# Patient Record
Sex: Female | Born: 1985 | Race: White | Hispanic: No | Marital: Single | Smoking: Never smoker
Health system: Southern US, Community
[De-identification: ages and names within clinical notes are randomized; demographics above are authoritative.]

## PROBLEM LIST (undated history)

## (undated) DIAGNOSIS — I10 Essential (primary) hypertension: Secondary | ICD-10-CM

## (undated) HISTORY — PX: OTHER SURGICAL HISTORY: SHX169

## (undated) HISTORY — DX: Essential (primary) hypertension: I10

---

## 2021-07-28 ENCOUNTER — Telehealth: Payer: Self-pay

## 2021-07-28 NOTE — Telephone Encounter (Signed)
REFERRAL ONLY SCANNED TO REFERRAL ?

## 2021-07-29 ENCOUNTER — Telehealth: Payer: Self-pay

## 2021-07-29 NOTE — Telephone Encounter (Signed)
NOTES SCANNED TO REFERRAL 

## 2021-09-12 ENCOUNTER — Encounter (HOSPITAL_BASED_OUTPATIENT_CLINIC_OR_DEPARTMENT_OTHER): Payer: Self-pay | Admitting: Cardiovascular Disease

## 2021-09-12 ENCOUNTER — Ambulatory Visit (INDEPENDENT_AMBULATORY_CARE_PROVIDER_SITE_OTHER): Payer: Managed Care, Other (non HMO) | Admitting: Cardiovascular Disease

## 2021-09-12 ENCOUNTER — Other Ambulatory Visit: Payer: Self-pay

## 2021-09-12 DIAGNOSIS — L659 Nonscarring hair loss, unspecified: Secondary | ICD-10-CM

## 2021-09-12 DIAGNOSIS — G90A Postural orthostatic tachycardia syndrome (POTS): Secondary | ICD-10-CM | POA: Diagnosis not present

## 2021-09-12 DIAGNOSIS — Q2116 Sinus venosus atrial septal defect, unspecified: Secondary | ICD-10-CM | POA: Diagnosis not present

## 2021-09-12 DIAGNOSIS — I10 Essential (primary) hypertension: Secondary | ICD-10-CM | POA: Diagnosis not present

## 2021-09-12 DIAGNOSIS — Z6835 Body mass index (BMI) 35.0-35.9, adult: Secondary | ICD-10-CM

## 2021-09-12 DIAGNOSIS — E669 Obesity, unspecified: Secondary | ICD-10-CM | POA: Diagnosis not present

## 2021-09-12 HISTORY — DX: Nonscarring hair loss, unspecified: L65.9

## 2021-09-12 HISTORY — DX: Essential (primary) hypertension: I10

## 2021-09-12 HISTORY — DX: Postural orthostatic tachycardia syndrome (POTS): G90.A

## 2021-09-12 HISTORY — DX: Obesity, unspecified: E66.9

## 2021-09-12 HISTORY — DX: Sinus venosus atrial septal defect, unspecified: Q21.16

## 2021-09-12 MED ORDER — ATENOLOL 25 MG PO TABS: ORAL_TABLET | ORAL | 3 refills | Status: DC

## 2021-09-12 MED ORDER — AMLODIPINE BESYLATE 2.5 MG PO TABS: 2.5000 mg | ORAL_TABLET | Freq: Every day | ORAL | 3 refills | Status: DC

## 2021-09-12 NOTE — Patient Instructions (Addendum)
Medication Instructions:  DECREASE YOUR ATENOLOL TO 25 MG 1/2 TABLET DAILY   START AMLODIPINE 2.5 MG DAILY   *If you need a refill on your cardiac medications before your next appointment, please call your pharmacy*  Lab Work: TSH/FT4/TS TODAY   If you have labs (blood work) drawn today and your tests are completely normal, you will receive your results only by: MyChart Message (if you have MyChart) OR A paper copy in the mail If you have any lab test that is abnormal or we need to change your treatment, we will call you to review the results.  Testing/Procedures: Your physician has requested that you have an echocardiogram. Echocardiography is a painless test that uses sound waves to create images of your heart. It provides your doctor with information about the size and shape of your heart and how well your heart's chambers and valves are working. This procedure takes approximately one hour. There are no restrictions for this procedure.   Follow-Up: At [facility], you and your health needs are our priority.  As part of our continuing mission to provide you with exceptional heart care, we have created designated Provider Care Teams.  These Care Teams include your primary Cardiologist (physician) and Advanced Practice Providers (APPs -  Physician Assistants and Nurse Practitioners) who all work together to provide you with the care you need, when you need it.  We recommend signing up for the patient portal called "MyChart".  Sign up information is provided on this After Visit Summary.  MyChart is used to connect with patients for Virtual Visits (Telemedicine).  Patients are able to view lab/test results, encounter notes, upcoming appointments, etc.  Non-urgent messages can be sent to your provider as well.   To learn more about what you can do with MyChart, go to [email or web address].    Your next appointment:   6 month(s)  The format for your next appointment:   In  Person  Provider:   Chilton Si, MD   Ronn Melena NP IN 1-2 MONTHS   You have been referred to   Where: [facility] Address: [address] [place] [address] Phone: [phone] IF YOU DO NOT HEAR FROM THEM IN 2 WEEKS CALL THEM DIRECTLY TO FOLLOW UP   Other Instructions MONITOR AND LOG YOUR BLOOD PRESSURE DAILY  BRING LOG AND MACHINE TO FOLLOW UP WITH CAITLIN

## 2021-09-12 NOTE — Assessment & Plan Note (Signed)
She has always struggled to lose weight despite eating well and exercising regularly.  She is interested in working with the healthy weight and wellness clinic.  We will also check a TSH, T3, and free T4 today.

## 2021-09-12 NOTE — Progress Notes (Signed)
Cardiology Office Note:    Date:  09/12/2021   ID:  Brittany [name], DOB 07/26/1986, MRN [id number]  PCP:  Pcp, No  Cardiologist:  None    Referring MD: No ref. provider found   No chief complaint on file.   History of Present Illness:    Brittany [name] is a 35 y.o. female with a hx of POTS who presents today for evaluation. She had an echo 04/2020 that revealed LVEF 65-70% and normal diastolic function. Her POTS has been managed with atenolol.  Today, she is doing well. She endorses POTS and a sinus venosus atrial septal defect. Her ASD was found on TEE performed in 2011 due to blood clots and discoloration in her L arm. She has been on aspirin since and denies recurrent blood clots. She was not qualified for surgery. She believes her blood pressure medicine is not effective. She used to be on Bystolic but was switched to atenolol due to dysphasia.  When she records her blood pressure at home, she reports measurements of 150s/100s which she attributes to stress from her job. Recently, she reports hair loss. She believes she was diagnosed with hypertension. Both of her parents were diagnosed with hypertension in their 1s. Her father has pericarditis and a pacemaker. Her maternal grandmother passed away this past 2022/12/01. She had dementia and had several strokes in the past year. She stays active by exercising 1 hour everyday and takes Wednesday and Saturday off occasionally. She has no exertional  symptoms. She has weights and a spin cycle machine at home and goes on a weekly 5.5 mile hike. She has lost 40lbs in the past year. For diet, she avoids red meat and opts for chicken, fish, and a mostly vegetarian diet. She avoids salt, soda, or juice. She occasionally drinks alcohol but only at social events. She works from home. She denies any palpitations, chest pain, or shortness of breath, lightheadedness, headaches, syncope, orthopnea, PND, lower extremity edema or exertional symptoms.  Past  Medical History:  Diagnosis Date   Essential hypertension 09/12/2021   Hair thinning 09/12/2021   Hypertension    Obesity 09/12/2021   POTS (postural orthostatic tachycardia syndrome) 09/12/2021   Sinus venosus atrial septal defect 09/12/2021    Past Surgical History:  Procedure Laterality Date   POTS      Current Medications: Current Meds  Medication Sig   aspirin EC 81 MG tablet Take 162 mg by mouth daily. Swallow whole.   atenolol (TENORMIN) 25 MG tablet Take 25 mg by mouth daily.   HAILEY FE 1.5/30 1.5-30 MG-MCG tablet Take 1 tablet by mouth daily.   vitamin C (ASCORBIC ACID) 500 MG tablet Take 500 mg by mouth daily.     Allergies:   Patient has no known allergies.   Social History   Socioeconomic History   Marital status: Single    Spouse name: Not on file   Number of children: Not on file   Years of education: Not on file   Highest education level: Not on file  Occupational History   Not on file  Tobacco Use   Smoking status: Never   Smokeless tobacco: Never  Substance and Sexual Activity   Alcohol use: Not Currently   Drug use: Never   Sexual activity: Not on file  Other Topics Concern   Not on file  Social History Narrative   Not on file   Social Determinants of Health   Financial Resource Strain: Low Risk    Difficulty of  Paying Living Expenses: Not hard at all  Food Insecurity: No Food Insecurity   Worried About Running Out of Food in the Last Year: Never true   Ran Out of Food in the Last Year: Never true  Transportation Needs: No Transportation Needs   Lack of Transportation (Medical): No   Lack of Transportation (Non-Medical): No  Physical Activity: Sufficiently Active   Days of Exercise per Week: 5 days   Minutes of Exercise per Session: 60 min  Stress: Not on file  Social Connections: Not on file     Family History: The patient's family history includes Brain cancer in her paternal grandmother; Dementia in her maternal grandmother;  Heart disease in her father; Hypertension in her father and mother; Stroke in her maternal grandmother.  ROS:   Please see the history of present illness.    (+) Sun sensitivity (+) Hair loss All other systems reviewed and negative.   EKGs/Labs/Other Studies Reviewed:     EKG:   09/12/21: Sinus rhythm, rate 64 bpm  Recent Labs: No results found for requested labs within last 8760 hours.   Recent Lipid Panel No results found for: CHOL, TRIG, HDL, CHOLHDL, VLDL, LDLCALC, LDLDIRECT  CHA2DS2-VASc Score =   [ ] .  Therefore, the patient's annual risk of stroke is   %.        Physical Exam:    VS:  BP 134/90 (BP Location: Left Arm, Patient Position: Sitting, Cuff Size: Large)   Pulse 64   Ht 5\' 11"  (1.803 m)   Wt 253 lb 9.6 oz (115 kg)   BMI 35.37 kg/m  , BMI Body mass index is 35.37 kg/m. GENERAL:  Well appearing HEENT: Pupils equal round and reactive, fundi not visualized, oral mucosa unremarkable NECK:  No jugular venous distention, waveform within normal limits, carotid upstroke brisk and symmetric, no bruits, no thyromegaly LUNGS:  Clear to auscultation bilaterally HEART:  RRR.  PMI not displaced or sustained,S1 and S2 within normal limits, no S3, no S4, no clicks, no rubs, no murmurs ABD:  Flat, positive bowel sounds normal in frequency in pitch, no bruits, no rebound, no guarding, no midline pulsatile mass, no hepatomegaly, no splenomegaly EXT:  2 plus pulses throughout, no edema, no cyanosis no clubbing SKIN:  No rashes no nodules NEURO:  Cranial nerves II through XII grossly intact, motor grossly intact throughout PSYCH:  Cognitively intact, oriented to person place and time  ASSESSMENT:    1. Sinus venosus atrial septal defect   2. POTS (postural orthostatic tachycardia syndrome)   3. Hair thinning   4. Class 2 obesity without serious comorbidity with body mass index (BMI) of 35.0 to 35.9 in adult, unspecified obesity type   5. Essential hypertension    PLAN:     Sinus venosus atrial septal defect She developed thrombosis in the L UE in 2012.  Maintained on aspirin without recurrent thrombosis.  We will get a baseline echo.  Going with her OB/GYN to get off of hormone-based OCPs.  POTS (postural orthostatic tachycardia syndrome) Symptoms controlled on atenolol and improved since moving from .  Her trigger is dehydration and sun exposure.  She will continue to work on staying hydrated.  We will reduce atenolol to 12.5 mg.    Hair thinning She has noted hair thinning on atenolol.  We will check TSH, T3, free T4.  Obesity She has always struggled to lose weight despite eating well and exercising regularly.  She is interested in working with the healthy  weight and wellness clinic.  We will also check a TSH, T3, and free T4 today.  Essential hypertension Blood pressure is poorly controlled on atenolol.  She is also having some symptoms of hair loss.  Given that her POTS is under good control, will try to wean the atenolol to 12.5 mg daily.  We will stop it if she remains asymptomatic.  Add amlodipine 2.5 mg and asked her to check her blood pressures at home.  Avoid diuretics given her POTS.  In order of problems listed above:      Medication Adjustments/Labs and Tests Ordered: Current medicines are reviewed at length with the patient today.  Concerns regarding medicines are outlined above.  No orders of the defined types were placed in this encounter.  No orders of the defined types were placed in this encounter.  Disposition: FU with APP in 1-2 months FU with Yeimi Debnam C. Oval Linsey, MD, [facility] in 6 months  I,Mykaella Javier,acting as a scribe for Skeet Latch, MD.,have documented all relevant documentation on the behalf of Skeet Latch, MD,as directed by  Skeet Latch, MD while in the presence of Skeet Latch, MD.  I, Hinckley Oval Linsey, MD have reviewed all documentation for this visit.  The documentation of the exam,  diagnosis, procedures, and orders on 09/12/2021 are all accurate and complete.   Signed, Skeet Latch, MD  09/12/2021 10:27 AM    [place]

## 2021-09-12 NOTE — Assessment & Plan Note (Addendum)
Symptoms controlled on atenolol and improved since moving from [place].  Her trigger is dehydration and sun exposure.  She will continue to work on staying hydrated.  We will reduce atenolol to 12.5 mg.

## 2021-09-12 NOTE — Assessment & Plan Note (Addendum)
Blood pressure is poorly controlled on atenolol.  She is also having some symptoms of hair loss.  Given that her POTS is under good control, will try to wean the atenolol to 12.5 mg daily.  We will stop it if she remains asymptomatic.  Add amlodipine 2.5 mg and asked her to check her blood pressures at home.  Avoid diuretics given her POTS.

## 2021-09-12 NOTE — Assessment & Plan Note (Signed)
She has noted hair thinning on atenolol.  We will check TSH, T3, free T4.

## 2021-09-12 NOTE — Assessment & Plan Note (Addendum)
She developed thrombosis in the L UE in 2012.  Maintained on aspirin without recurrent thrombosis.  We will get a baseline echo.  Going with her OB/GYN to get off of hormone-based OCPs.

## 2021-09-13 LAB — T3: T3, Total: 135 ng/dL (ref 71–180)

## 2021-09-13 LAB — TSH: TSH: 2.42 u[IU]/mL (ref 0.450–4.500)

## 2021-09-13 LAB — T4, FREE: Free T4: 1.03 ng/dL (ref 0.82–1.77)

## 2021-09-27 ENCOUNTER — Other Ambulatory Visit: Payer: Self-pay

## 2021-09-27 ENCOUNTER — Ambulatory Visit (INDEPENDENT_AMBULATORY_CARE_PROVIDER_SITE_OTHER): Payer: Managed Care, Other (non HMO)

## 2021-09-27 DIAGNOSIS — Q2116 Sinus venosus atrial septal defect, unspecified: Secondary | ICD-10-CM

## 2021-09-27 DIAGNOSIS — I10 Essential (primary) hypertension: Secondary | ICD-10-CM | POA: Diagnosis not present

## 2021-09-27 DIAGNOSIS — E669 Obesity, unspecified: Secondary | ICD-10-CM

## 2021-09-27 DIAGNOSIS — Z6835 Body mass index (BMI) 35.0-35.9, adult: Secondary | ICD-10-CM | POA: Diagnosis not present

## 2021-09-27 LAB — ECHOCARDIOGRAM COMPLETE
AR max vel: 2.74 cm2
AV Area VTI: 2.71 cm2
AV Area mean vel: 2.48 cm2
AV Mean grad: 4 mmHg
AV Peak grad: 6.7 mmHg
Ao pk vel: 1.29 m/s
Area-P 1/2: 3.21 cm2
Calc EF: 60.1 %
S' Lateral: 3.29 cm
Single Plane A2C EF: 57.1 %
Single Plane A4C EF: 62.4 %

## 2021-11-15 ENCOUNTER — Other Ambulatory Visit: Payer: Self-pay

## 2021-11-15 ENCOUNTER — Encounter (HOSPITAL_BASED_OUTPATIENT_CLINIC_OR_DEPARTMENT_OTHER): Payer: Self-pay | Admitting: Family

## 2021-11-15 ENCOUNTER — Ambulatory Visit (INDEPENDENT_AMBULATORY_CARE_PROVIDER_SITE_OTHER): Payer: Managed Care, Other (non HMO) | Admitting: Family

## 2021-11-15 VITALS — BP 118/84 | HR 70 | Ht 71.0 in | Wt 252.6 lb

## 2021-11-15 DIAGNOSIS — G90A Postural orthostatic tachycardia syndrome (POTS): Secondary | ICD-10-CM | POA: Diagnosis not present

## 2021-11-15 DIAGNOSIS — I10 Essential (primary) hypertension: Secondary | ICD-10-CM

## 2021-11-15 DIAGNOSIS — Z6835 Body mass index (BMI) 35.0-35.9, adult: Secondary | ICD-10-CM

## 2021-11-15 DIAGNOSIS — E669 Obesity, unspecified: Secondary | ICD-10-CM | POA: Diagnosis not present

## 2021-11-15 DIAGNOSIS — Q2116 Sinus venosus atrial septal defect, unspecified: Secondary | ICD-10-CM | POA: Diagnosis not present

## 2021-11-15 MED ORDER — ATENOLOL 25 MG PO TABS: 12.5000 mg | ORAL_TABLET | Freq: Every day | ORAL | 2 refills | Status: AC | PRN

## 2021-11-15 NOTE — Progress Notes (Signed)
Office Visit    Patient Name: Brittany [name] Date of Encounter: 11/15/2021  PCP:  Kathyrn Lass   [place]  Cardiologist:  Skeet Latch, MD  Advanced Practice Provider:  No care team member to display Electrophysiologist:  None      Chief Complaint    Brittany [name] is a 36 y.o. female with a hx of  ASD, hypertension, POTS presents today for follow-up after reduced dose of atenolol.  Past Medical History    Past Medical History:  Diagnosis Date   Essential hypertension 09/12/2021   Hair thinning 09/12/2021   Hypertension    Obesity 09/12/2021   POTS (postural orthostatic tachycardia syndrome) 09/12/2021   Sinus venosus atrial septal defect 09/12/2021   Past Surgical History:  Procedure Laterality Date   POTS      Allergies  No Known Allergies  History of Present Illness    Brittany [name] is a 36 y.o. female with a hx of ASD, hypertension, POTS last seen 09/12/21.  Family history notable for hypertension in both her parents.  Father with pericarditis and PPM.  Maternal grandmother with dementia and several strokes.  ASD was found on TEE performed in 2011 due to blood clots and discoloration in the left arm.  She has been on aspirin since that time without recurrent blood clot.  Previously on Bystolic but transition to atenolol due to dysphagia. Echocardiogram July 2021 LVEF 65 to [id number], normal diastolic function.  Her POTS has been managed with atenolol.    When seen 09/12/2021 by Dr. Oval Linsey she noted hair loss, elevated blood pressure at home.  She had lost 40 pounds over the last year through diet and exercise.  Thyroid function was found to be normal.  Atenolol dose reduced due to hair loss and amlodipine 2.5 mg added for blood pressure.  Recommended for avoidance of diuretic.  Updated echocardiogram 09/27/2021 with ASD not visualized.  LVEF 60 to 65%, no RWMA, no significant valvular abnormalities.  She presents today for follow-up.  BP at home well controlled. Her home BP when checked for accuracy was found to be higher than manual check (135/96). POTS symptoms similar compared to previous.Continues to stay active hiking [place]home. We reviewed her echo and she shares with me that she had TEE about 11 or 12 years ago in [place] with Dr. Loraine Maple. Reports no shortness of breath nor dyspnea on exertion. Reports no chest pain, pressure, or tightness. No edema, orthopnea, PND. Reports no palpitations.      EKGs/Labs/Other Studies Reviewed:   The following studies were reviewed today  Echo 09/27/2021  1. History of sinus venosus ASD. This was not visualized on this study.  The RA/RV appear normal size and the RVSP is normal. If there are clinical  concerns for a sinus venosus ASD with expected PAPVR, would recommend TEE  or cardiac CT for clarification.   2. Left ventricular ejection fraction, by estimation, is 60 to 65%. Left  ventricular ejection fraction by 3D volume is 60 %. The left ventricle has  normal function. The left ventricle has no regional wall motion  abnormalities. Left ventricular diastolic   parameters were normal.   3. Right ventricular systolic function is normal. The right ventricular  size is normal. There is normal pulmonary artery systolic pressure. The  estimated right ventricular systolic pressure is 123456 mmHg.   4. The mitral valve is grossly normal. Trivial mitral valve  regurgitation. No evidence of mitral  stenosis.   5. The aortic valve is tricuspid. Aortic valve regurgitation is not  visualized. No aortic stenosis is present.   6. The inferior vena cava is normal in size with greater than 50%  respiratory variability, suggesting right atrial pressure of 3 mmHg.   EKG: No EKG today.  Recent Labs: 09/12/2021: TSH 2.420  Recent Lipid Panel No results found for: CHOL, TRIG, HDL, CHOLHDL, VLDL, LDLCALC, LDLDIRECT   Home Medications   Current Meds   Medication Sig   amLODipine (NORVASC) 2.5 MG tablet Take 1 tablet (2.5 mg total) by mouth daily.   aspirin EC 81 MG tablet Take 162 mg by mouth daily. Swallow whole.   HAILEY FE 1.5/30 1.5-30 MG-MCG tablet Take 1 tablet by mouth daily.   magnesium oxide (MAG-OX) 400 MG tablet Take 1 tablet by mouth daily at 12 noon.   vitamin C (ASCORBIC ACID) 500 MG tablet Take 500 mg by mouth daily.   [DISCONTINUED] atenolol (TENORMIN) 25 MG tablet TAKE 1/2 TABLET DAILY    Review of Systems      All other systems reviewed and are otherwise negative except as noted above.  Physical Exam    VS:  BP 118/84 (BP Location: Left Arm, Patient Position: Sitting, Cuff Size: Large)    Pulse 70    Ht 5\' 11"  (1.803 m)    Wt 252 lb 9.6 oz (114.6 kg)    SpO2 99%    BMI 35.23 kg/m  , BMI Body mass index is 35.23 kg/m.  Wt Readings from Last 3 Encounters:  11/15/21 252 lb 9.6 oz (114.6 kg)  09/12/21 253 lb 9.6 oz (115 kg)    GEN: Well nourished, well developed, in no acute distress. HEENT: normal. Neck: Supple, no JVD, carotid bruits, or masses. Cardiac: RRR, no murmurs, rubs, or gallops. No clubbing, cyanosis, edema.  Radials/PT 2+ and equal bilaterally.  Respiratory:  Respirations regular and unlabored, clear to auscultation bilaterally. GI: Soft, nontender, nondistended. MS: No deformity or atrophy. Skin: Warm and dry, no rash. Neuro:  Strength and sensation are intact. Psych: Normal affect.  Assessment & Plan    Sinus venous atrial septal defect -she developed thrombosis in the left upper extremity 2012.  Maintained on aspirin without recurrent DVT.Marland Kitchen  Recent TTE without evidence of ASD, due to above.  She is asymptomatic she prefers to avoid TEE if possible.  She tells me she was diagnosed by TEE about 10 years ago in [place] with Dr. Terrall Laity and we will request record.   POTS -Triggers are dehydration and sun exposure.  No worsening symptoms since reduced dose of atenolol.  Due to hair thinning we will  reduce her atenolol to as needed.  She will contact us for worsening Symptoms.  She will continue to make position changes slowly, stay well-hydrated, recommend utilization of compression stockings.  Hair thinning - Thyroid function 09/2021 normal. Reduce Atenolol to PRN to assess improvement.   Obesity - Weight loss via diet and exercise encouraged. Discussed the impact being overweight would have on cardiovascular risk.   Hypertension - BP well controlled. Continue Amlodipine 2.5mg  QD. As we are discontinuing Atenolol, check in via MyChart message in 2 weeks to ensure BP still at goal of <130/80.   Disposition: Follow up in 4 month(s) with Skeet Latch, MD or APP.  Signed, Loel Dubonnet, NP 11/15/2021, 3:11 PM [place]

## 2021-11-15 NOTE — Patient Instructions (Signed)
Medication Instructions:  Your physician has recommended you make the following change in your medication:   CHANGE Atenolol to as needed   CONTINUE Amlodipine 2.5mg  daily *if your blood pressure is consistently more than 130/80 we will consider adjusting the dose.    *If you need a refill on your cardiac medications before your next appointment, please call your pharmacy*  Lab Work: None ordered today.   Testing/Procedures: Your echocardiogram showed normal heart pumping function which is a good result.   Follow-Up: At [facility], you and your health needs are our priority.  As part of our continuing mission to provide you with exceptional heart care, we have created designated Provider Care Teams.  These Care Teams include your primary Cardiologist (physician) and Advanced Practice Providers (APPs -  Physician Assistants and Nurse Practitioners) who all work together to provide you with the care you need, when you need it.  We recommend signing up for the patient portal called "MyChart".  Sign up information is provided on this After Visit Summary.  MyChart is used to connect with patients for Virtual Visits (Telemedicine).  Patients are able to view lab/test results, encounter notes, upcoming appointments, etc.  Non-urgent messages can be sent to your provider as well.   To learn more about what you can do with MyChart, go to [email or web address].    Your next appointment:   As scheduled in June with Dr. Duke Salvia  Other Instructions  Heart Healthy Diet Recommendations: A low-salt diet is recommended. Meats should be grilled, baked, or boiled. Avoid fried foods. Focus on lean protein sources like fish or chicken with vegetables and fruits. The American Heart Association is a Chief Technology Officer!  American Heart Association Diet and Lifeystyle Recommendations    Exercise recommendations: The American Heart Association recommends 150 minutes of moderate intensity exercise  weekly. Try 30 minutes of moderate intensity exercise 4-5 times per week. This could include walking, jogging, or swimming.

## 2021-11-21 ENCOUNTER — Other Ambulatory Visit: Payer: Self-pay

## 2021-11-21 MED ORDER — AMLODIPINE BESYLATE 2.5 MG PO TABS: 2.5000 mg | ORAL_TABLET | Freq: Every day | ORAL | 3 refills | Status: DC

## 2021-11-23 ENCOUNTER — Encounter (HOSPITAL_BASED_OUTPATIENT_CLINIC_OR_DEPARTMENT_OTHER): Payer: Self-pay | Admitting: *Deleted

## 2021-11-23 ENCOUNTER — Telehealth (HOSPITAL_BASED_OUTPATIENT_CLINIC_OR_DEPARTMENT_OTHER): Payer: Self-pay | Admitting: Family

## 2021-11-23 ENCOUNTER — Encounter (HOSPITAL_BASED_OUTPATIENT_CLINIC_OR_DEPARTMENT_OTHER): Payer: Self-pay

## 2021-11-23 NOTE — Telephone Encounter (Signed)
Received records from [place] Dr. Terrall Laity.   Clinic note from 04/28/20 states 04/21/20 TTE with preserved LVSF with positive bubble study and stable RVSP 108mmHg.   Clinic notes and echocardiogram report will be uploaded to Epic.   Loel Dubonnet, NP

## 2021-11-29 ENCOUNTER — Encounter (HOSPITAL_BASED_OUTPATIENT_CLINIC_OR_DEPARTMENT_OTHER): Payer: Self-pay

## 2022-03-16 ENCOUNTER — Encounter (HOSPITAL_BASED_OUTPATIENT_CLINIC_OR_DEPARTMENT_OTHER): Payer: Self-pay | Admitting: Cardiovascular Disease

## 2022-03-16 ENCOUNTER — Ambulatory Visit (INDEPENDENT_AMBULATORY_CARE_PROVIDER_SITE_OTHER): Payer: Managed Care, Other (non HMO) | Admitting: Cardiovascular Disease

## 2022-03-16 ENCOUNTER — Encounter (HOSPITAL_BASED_OUTPATIENT_CLINIC_OR_DEPARTMENT_OTHER): Payer: Self-pay

## 2022-03-16 DIAGNOSIS — Z6835 Body mass index (BMI) 35.0-35.9, adult: Secondary | ICD-10-CM

## 2022-03-16 DIAGNOSIS — E669 Obesity, unspecified: Secondary | ICD-10-CM

## 2022-03-16 DIAGNOSIS — G90A Postural orthostatic tachycardia syndrome (POTS): Secondary | ICD-10-CM

## 2022-03-16 DIAGNOSIS — I10 Essential (primary) hypertension: Secondary | ICD-10-CM

## 2022-03-16 DIAGNOSIS — E7800 Pure hypercholesterolemia, unspecified: Secondary | ICD-10-CM | POA: Diagnosis not present

## 2022-03-16 DIAGNOSIS — E785 Hyperlipidemia, unspecified: Secondary | ICD-10-CM

## 2022-03-16 HISTORY — DX: Hyperlipidemia, unspecified: E78.5

## 2022-03-16 NOTE — Assessment & Plan Note (Signed)
Overall doing well unless she goes walking when she is hot.

## 2022-03-16 NOTE — Progress Notes (Signed)
Cardiology Office Note:    Date:  03/16/2022   ID:  Brittany [name], DOB Feb 14, 1986, MRN [id number]  PCP:  Pcp, No  Cardiologist:  Chilton Si, MD    Referring MD: No ref. provider found   No chief complaint on file.   History of Present Illness:    Brittany [name] is a 36 y.o. female with a hx of POTS who presents for follow up, but was first seen for the evaluation of POTs. She had an echo 04/2020 that revealed LVEF 65-70% and normal diastolic function. Her POTS has been managed with atenolol. She is doing well. She reports POTS and a sinus venosus atrial septal defect. Her ASD was found on TEE performed in 2011 due to blood clots and discoloration in her L arm. She has been on aspirin since and denies recurrent blood clots. She was not qualified for surgery. She believes her blood pressure medicine is not effective. She used to be on Bystolic but was switched to atenolol due to dysphasia.  When she records her blood pressure at home, she reports measurements of 150s/100s which she attributes to stress from her job. Recently, she reports hair loss.   Her blood pressure was poorly controlled on atenolol and amlodipine was added.  She started taking the atenolol on an as-needed basis and has not needed it lately.  Today, she states she recently had Covid while she was with her father. She adds that she has taken the booster shots and vaccines. She tried to walk last weekend but experienced headaches afterwards. Of note, it was hot that [name]. She stays hydrated with 64 oz three times a [name], and liquid IV. She states that she has a headache and nausea whenever she tries to walk  She stopped taking her birth control and her blood pressure decreased to numbers like 128/75.  She switched to Nexplanon.  Her diastolic pressure is normally around 70s ever since she started Nexpanon and B vitamin complex, but they used to be in the 90s.  She exercises 4-5 days per week, but it has been less since after  Covid. She plans to start exercising back 5 days per week again. She eats one animal protein at dinner, and vegetable meals at lunch time. She limits her caffeine and does not drink soda.  The patient denies chest pain, shortness of breath, nocturnal dyspnea, orthopnea or peripheral edema. There have been no palpitations, lightheadedness or syncope. Complains of headaches.   Past Medical History:  Diagnosis Date   Essential hypertension 09/12/2021   Hair thinning 09/12/2021   Hyperlipidemia 03/16/2022   Hypertension    Obesity 09/12/2021   POTS (postural orthostatic tachycardia syndrome) 09/12/2021   Sinus venosus atrial septal defect 09/12/2021    Past Surgical History:  Procedure Laterality Date   POTS      Current Medications: Current Meds  Medication Sig   amLODipine (NORVASC) 2.5 MG tablet Take 1 tablet (2.5 mg total) by mouth daily.   aspirin EC 81 MG tablet Take 162 mg by mouth daily. Swallow whole.   atenolol (TENORMIN) 25 MG tablet Take 0.5 tablets (12.5 mg total) by mouth daily as needed.   b complex vitamins capsule Take 1 capsule by mouth daily.   etonogestrel (NEXPLANON) 68 MG IMPL implant    magnesium oxide (MAG-OX) 400 MG tablet Take 1 tablet by mouth daily at 12 noon.   vitamin C (ASCORBIC ACID) 500 MG tablet Take 500 mg by mouth daily.     Allergies:  Patient has no known allergies.   Social History   Socioeconomic History   Marital status: Single    Spouse name: Not on file   Number of children: Not on file   Years of education: Not on file   Highest education level: Not on file  Occupational History   Not on file  Tobacco Use   Smoking status: Never   Smokeless tobacco: Never  Substance and Sexual Activity   Alcohol use: Not Currently   Drug use: Never   Sexual activity: Not on file  Other Topics Concern   Not on file  Social History Narrative   Not on file   Social Determinants of Health   Financial Resource Strain: Low Risk  (09/12/2021)    Overall Financial Resource Strain (CARDIA)    Difficulty of Paying Living Expenses: Not hard at all  Food Insecurity: No Food Insecurity (09/12/2021)   Hunger Vital Sign    Worried About Running Out of Food in the Last Year: Never true    Ran Out of Food in the Last Year: Never true  Transportation Needs: No Transportation Needs (09/12/2021)   PRAPARE - Administrator, Civil Service (Medical): No    Lack of Transportation (Non-Medical): No  Physical Activity: Sufficiently Active (09/12/2021)   Exercise Vital Sign    Days of Exercise per Week: 5 days    Minutes of Exercise per Session: 60 min  Stress: Not on file  Social Connections: Not on file     Family History: The patient's family history includes Brain cancer in her paternal grandmother; Dementia in her maternal grandmother; Heart disease in her father; Hypertension in her father and mother; Stroke in her maternal grandmother.  ROS:   Please see the history of present illness.    (+) Headaches  (+) Nausea  All other systems reviewed and negative.   EKGs/Labs/Other Studies Reviewed:     Echo 09/2021: with LVEF 60-65% and normal diastolic function. There was no evidence of an ASD. She followed up with her   on 11/2021, and her blood pressure was well controlled.   EKG:   03/08/2022 EKG: Rate 83. Sinus Arhythmia. 09/12/21: Sinus rhythm, rate 64 bpm  Recent Labs: 09/12/2021: TSH 2.420   Recent Lipid Panel No results found for: "CHOL", "TRIG", "HDL", "CHOLHDL", "VLDL", "LDLCALC", "LDLDIRECT"  01/03/22: Total cholesterol 201, triglycerides 98, HDL 38, LDL 146 TSH 2.0 WBC 5.8, hemoglobin 14, hematocrit 41.4, platelets 254 Sodium 137, potassium 3.9, BUN 13, creatinine 0.92 AST 13, ALT 13  Physical Exam:    VS:  BP 120/82 (BP Location: Right Arm, Patient Position: Sitting, Cuff Size: Large)   Pulse 83   Ht 5\' 11"  (1.803 m)   Wt 243 lb 8 oz (110.5 kg)   BMI 33.96 kg/m  , BMI Body mass index is 33.96  kg/m. GENERAL:  Well appearing HEENT: Pupils equal round and reactive, fundi not visualized, oral mucosa unremarkable NECK:  No jugular venous distention, waveform within normal limits, carotid upstroke brisk and symmetric, no bruits, no thyromegaly LUNGS:  Clear to auscultation bilaterally HEART:  RRR.  PMI not displaced or sustained,S1 and S2 within normal limits, no S3, no S4, no clicks, no rubs, no murmurs ABD:  Flat, positive bowel sounds normal in frequency in pitch, no bruits, no rebound, no guarding, no midline pulsatile mass, no hepatomegaly, no splenomegaly EXT:  2 plus pulses throughout, no edema, no cyanosis no clubbing SKIN:  No rashes no nodules NEURO:  Cranial  nerves II through XII grossly intact, motor grossly intact throughout [facility]:  Cognitively intact, oriented to person place and time  ASSESSMENT:    1. Pure hypercholesterolemia   2. POTS (postural orthostatic tachycardia syndrome)   3. Essential hypertension   4. Class 2 obesity without serious comorbidity with body mass index (BMI) of 35.0 to 35.9 in adult, unspecified obesity type     PLAN:    Hyperlipidemia Lipids are elevated.  Unable to calculate 10 year risk due to her age.  We will get a coronary calcium score to better understand her risk and see if she needs a statin.  POTS (postural orthostatic tachycardia syndrome) Overall doing well unless she goes walking when she is hot.    Essential hypertension BP has been lower after switching from OCP to Nexplanon and noted a decrease in her BP.  110s/70s at home.  She has only been taking atenolol.  She will hold the amlodipine and track her BP for a couple week.s  If it remains <130/80 then she can continue to hold it.    Obesity Continue working on diet and exercise.  She is doing a great job.  Continue moving towards a plant-based diet.   In order of problems listed above:  Medication Adjustments/Labs and Tests Ordered: Current medicines are reviewed  at length with the patient today.  Concerns regarding medicines are outlined above.  Orders Placed This Encounter  Procedures   CT CARDIAC SCORING (SELF PAY ONLY)   EKG 12-Lead    No orders of the defined types were placed in this encounter.  Hyperlipidemia Lipids are elevated.  Unable to calculate 10 year risk due to her age.  We will get a coronary calcium score to better understand her risk and see if she needs a statin.  POTS (postural orthostatic tachycardia syndrome) Overall doing well unless she goes walking when she is hot.    Essential hypertension BP has been lower after switching from OCP to Nexplanon and noted a decrease in her BP.  110s/70s at home.  She has only been taking atenolol.  She will hold the amlodipine and track her BP for a couple week.s  If it remains <130/80 then she can continue to hold it.    Obesity Continue working on diet and exercise.  She is doing a great job.  Continue moving towards a plant-based diet.    Disposition: FU with APP in 1-2 months FU with Maddix Kliewer C. Duke Salvia, MD, [facility] in 1 year.   I,Tinashe Williams,acting as a Neurosurgeon for [employer], MD.,have documented all relevant documentation on the behalf of Chilton Si, MD,as directed by  Chilton Si, MD while in the presence of Chilton Si, MD.   I, Banner Huckaba C. Duke Salvia, MD have reviewed all documentation for this visit.  The documentation of the exam, diagnosis, procedures, and orders on 03/16/2022 are all accurate and complete.

## 2022-03-16 NOTE — Patient Instructions (Signed)
Medication Instructions:  HOLD ATENOLOL FOR 2 WEEKS  MONITOR AND LOG BLOOD PRESSURE. CALL THE OFFICE WITH UPDATED READINGS  *If you need a refill on your cardiac medications before your next appointment, please call your pharmacy*  Lab Work: NONE  Testing/Procedures: CALCIUM SCORE- THIS WILL COST $99 OUT OF POCKET   Follow-Up: At [facility], you and your health needs are our priority.  As part of our continuing mission to provide you with exceptional heart care, we have created designated Provider Care Teams.  These Care Teams include your primary Cardiologist (physician) and Advanced Practice Providers (APPs -  Physician Assistants and Nurse Practitioners) who all work together to provide you with the care you need, when you need it.  We recommend signing up for the patient portal called "MyChart".  Sign up information is provided on this After Visit Summary.  MyChart is used to connect with patients for Virtual Visits (Telemedicine).  Patients are able to view lab/test results, encounter notes, upcoming appointments, etc.  Non-urgent messages can be sent to your provider as well.   To learn more about what you can do with MyChart, go to [email or web address].    Your next appointment:   12 month(s)  The format for your next appointment:   In Person  Provider:   Chilton Si, MD

## 2022-03-16 NOTE — Assessment & Plan Note (Addendum)
BP has been lower after switching from OCP to Nexplanon and noted a decrease in her BP.  110s/70s at home.  She has only been taking atenolol.  She will hold the amlodipine and track her BP for a couple week.s  If it remains <130/80 then she can continue to hold it.

## 2022-03-16 NOTE — Assessment & Plan Note (Signed)
Continue working on diet and exercise.  She is doing a great job.  Continue moving towards a plant-based diet.

## 2022-03-16 NOTE — Assessment & Plan Note (Addendum)
Lipids are elevated.  Unable to calculate 10 year risk due to her age.  We will get a coronary calcium score to better understand her risk and see if she needs a statin.

## 2022-03-23 ENCOUNTER — Ambulatory Visit (HOSPITAL_BASED_OUTPATIENT_CLINIC_OR_DEPARTMENT_OTHER)
Admission: RE | Admit: 2022-03-23 | Discharge: 2022-03-23 | Disposition: A | Discharge status: Home / Self Care | Payer: Managed Care, Other (non HMO) | Source: Ambulatory Visit | Attending: Cardiovascular Disease | Admitting: Cardiovascular Disease

## 2022-03-23 DIAGNOSIS — I10 Essential (primary) hypertension: Secondary | ICD-10-CM

## 2022-03-23 DIAGNOSIS — E7800 Pure hypercholesterolemia, unspecified: Secondary | ICD-10-CM | POA: Insufficient documentation

## 2022-03-29 ENCOUNTER — Telehealth (HOSPITAL_BASED_OUTPATIENT_CLINIC_OR_DEPARTMENT_OTHER): Payer: Self-pay | Admitting: *Deleted

## 2022-03-29 DIAGNOSIS — E7800 Pure hypercholesterolemia, unspecified: Secondary | ICD-10-CM

## 2022-03-29 DIAGNOSIS — Z5181 Encounter for therapeutic drug level monitoring: Secondary | ICD-10-CM

## 2022-03-29 DIAGNOSIS — R911 Solitary pulmonary nodule: Secondary | ICD-10-CM

## 2022-03-29 MED ORDER — ROSUVASTATIN CALCIUM 10 MG PO TABS
10.0000 mg | ORAL_TABLET | Freq: Every day | ORAL | 3 refills | Status: DC
Start: 2022-03-29 — End: 2023-03-05

## 2022-03-29 NOTE — Telephone Encounter (Signed)
Advised patient of lab results  Order for repeat CT placed

## 2022-03-29 NOTE — Telephone Encounter (Signed)
-----   Message from Chilton Si, MD sent at 03/27/2022  4:02 PM EDT ----- Very small pulmonary nodule seen.  Repeat a noncontrast CT in 1 year to make sure it is stable.  It did show that she has a small amount of calcium in her heart arteries.  Given her age this is abnormal.  We need to be very aggressive with her cholesterol management to lower her risk of heart attacks and strokes in the long run.  I do think that she should be on a statin to lower her risk.  Recommend rosuvastatin 10 mg.  Repeat lipids and a CMP in 2 to 3 months.

## 2022-08-21 ENCOUNTER — Ambulatory Visit: Payer: Managed Care, Other (non HMO) | Attending: Cardiovascular Disease

## 2022-08-22 LAB — LIPID PANEL
Chol/HDL Ratio: 3 ratio (ref 0.0–4.4)
Cholesterol, Total: 126 mg/dL (ref 100–199)
HDL: 42 mg/dL (ref >39)
LDL Chol Calc (NIH): 72 mg/dL (ref 0–99)
Triglycerides: 55 mg/dL (ref 0–149)
VLDL Cholesterol Cal: 12 mg/dL (ref 5–40)

## 2022-08-22 LAB — COMPREHENSIVE METABOLIC PANEL
ALT: 18 IU/L (ref 0–32)
AST: 16 IU/L (ref 0–40)
Albumin/Globulin Ratio: 2 (ref 1.2–2.2)
Albumin: 4.5 g/dL (ref 3.9–4.9)
Alkaline Phosphatase: 71 IU/L (ref 44–121)
BUN/Creatinine Ratio: 13 (ref 9–23)
BUN: 12 mg/dL (ref 6–20)
Bilirubin Total: 0.5 mg/dL (ref 0.0–1.2)
CO2: 24 mmol/L (ref 20–29)
Calcium: 9.4 mg/dL (ref 8.7–10.2)
Chloride: 101 mmol/L (ref 96–106)
Creatinine, Ser: 0.9 mg/dL (ref 0.57–1.00)
Globulin, Total: 2.3 g/dL (ref 1.5–4.5)
Glucose: 88 mg/dL (ref 70–99)
Potassium: 4 mmol/L (ref 3.5–5.2)
Sodium: 138 mmol/L (ref 134–144)
Total Protein: 6.8 g/dL (ref 6.0–8.5)
eGFR: 85 mL/min/{1.73_m2} (ref >59)

## 2023-02-06 ENCOUNTER — Telehealth (HOSPITAL_BASED_OUTPATIENT_CLINIC_OR_DEPARTMENT_OTHER): Payer: Self-pay | Admitting: Cardiovascular Disease

## 2023-02-06 NOTE — Telephone Encounter (Signed)
Left message for patient to call and discuss scheduling the follow up CT chest ordered by Dr. Duke Salvia

## 2023-02-12 ENCOUNTER — Other Ambulatory Visit: Payer: Self-pay | Admitting: Cardiovascular Disease

## 2023-02-12 NOTE — Telephone Encounter (Signed)
Rx(s) sent to pharmacy electronically.  

## 2023-02-13 NOTE — Telephone Encounter (Signed)
Spoke with patient to schedule the CT chest without contrast ordered by Dr. Henrietta Hoover states she wants an early am at [facility]--states she will check her My Chart later this afternoon for the appointment information

## 2023-03-05 ENCOUNTER — Other Ambulatory Visit (HOSPITAL_BASED_OUTPATIENT_CLINIC_OR_DEPARTMENT_OTHER): Payer: Self-pay | Admitting: Cardiovascular Disease

## 2023-03-05 NOTE — Telephone Encounter (Signed)
Rx request sent to pharmacy.  

## 2023-03-05 NOTE — Telephone Encounter (Signed)
Please call pt to scheduled 1 year follow-up appointment with Dr. Duke Salvia or APP for 90 day refills. 30 day rx sent in today. Thank you!

## 2023-03-08 NOTE — Telephone Encounter (Signed)
Scheduled 04/09/23 with Gillian Shields, NP

## 2023-03-23 ENCOUNTER — Telehealth: Payer: Self-pay | Admitting: Cardiovascular Disease

## 2023-03-23 DIAGNOSIS — R911 Solitary pulmonary nodule: Secondary | ICD-10-CM

## 2023-03-23 NOTE — Telephone Encounter (Signed)
Insurance called to say patient procedure was denied because the form wasn't filled out right. It didn't show medical necessity. States our office need to cal Physician Support Unit [phone] and request a peer to peer. So, the patient can have it done at [facility]. Please advise

## 2023-03-23 NOTE — Telephone Encounter (Signed)
Patient has questions regarding billing/insurance  Will forward to billing department to reach out

## 2023-03-23 NOTE — Telephone Encounter (Signed)
Left message to call back  If needing to schedule CT sent to [facility], she spoke with patient regarding scheduling in May

## 2023-03-23 NOTE — Telephone Encounter (Signed)
Patient is returning call. Transferred to Melinda, LPN.  

## 2023-03-23 NOTE — Telephone Encounter (Signed)
Hello Brittany [name],    I contacted the pt and provided her with the code 40981 for CT CHEST WO CONTRAST [[id number]] and provided an estimate for the pt. I advised the pt that she can call her insurance company with this code and they can provide her with exactly what her portion of the bill will be for this procedure,    Thanks,  Jonette Pesa

## 2023-03-23 NOTE — Telephone Encounter (Signed)
Pt would like a callback regarding upcoming CT. Please advise

## 2023-03-23 NOTE — Telephone Encounter (Signed)
Will forward to precert department to try resubmitting

## 2023-03-24 ENCOUNTER — Other Ambulatory Visit (HOSPITAL_BASED_OUTPATIENT_CLINIC_OR_DEPARTMENT_OTHER): Payer: Self-pay | Admitting: Cardiovascular Disease

## 2023-03-26 MED ORDER — ROSUVASTATIN CALCIUM 10 MG PO TABS: 10.0000 mg | ORAL_TABLET | Freq: Every day | ORAL | 0 refills | Status: DC

## 2023-03-28 ENCOUNTER — Ambulatory Visit (HOSPITAL_BASED_OUTPATIENT_CLINIC_OR_DEPARTMENT_OTHER): Payer: Managed Care, Other (non HMO)

## 2023-03-28 NOTE — Telephone Encounter (Signed)
----- Message from Francine Graven sent at 03/27/2023  8:43 AM EDT ----- Regarding: RE: FACILITY DENIAL Yes ----- Message ----- From: Burnell Blanks, LPN Sent: 11/15/863   6:09 PM EDT To: Claudean Severance Abdul-Razzaaq Subject: RE: Rodolph Bong                            Does this mean that she could have it at somewhere like [facility]?   ----- Message ----- From: Francine Graven Sent: 03/26/2023   1:42 PM EDT To: Chilton Si, MD; Burnell Blanks, LPN Subject: FACILITY DENIAL                                Good afternoon,   Rosann Auerbach has denied pt having the procedure at [facility] facility. Following are the details.  We received your request to cover the following services, to be provided at [facility]: 1 Unit([place]) of [address] Computed Tomography (CT), a special kind of picture of your chest without contrast (dye)  Based on the information your health care professional sent [place], we cannot approve all of this request. This letter explains why. It describes your right to ask for another review. It also describes the steps you or your health care professional can take to make that request. Summary of the Decision  Procedure Description Requested Decision N/A Servicing Facility: [facility] N/A [address] Computed Tomography (CT), a special kind of picture of your chest without contrast (dye) 1 Approved Date Received: March 20, 2023 Reviewer: [employer] ([employer]) [employer] partners with [employer], a leading health and wellness company, to manage our diagnostic program. [employer] reviews diagnostic services and facilities to determine if they are medically necessary and covered by your plan. What's Not Approved: At this time, we are not able to approve your request to receive the service(s) at [facility]. Please Understand: If you have or had this service at the  requested facility, your plan will not pay for it. We are unable to approve the requested service(s) to be received at [facility]. We have made your health care provider aware of other facilities which would be approved and where the procedure(s) may be covered and performed. Call your health care provider to request a new or revised authorization with an approved facility. What's Approved: After reviewing the information we received, as well as your health plan, we determined we will cover the following, if provided in a less-intensive setting under a new or revised authorization: [address] Computed Tomography (CT), a special kind of picture of your chest without contrast (dye) The request for the approved service(s) to be provided at [facility] is denied. Please see the "What's Not Approved" section of this letter above. Important: In order to be covered, you must be enrolled in the plan and eligible for benefits on the date you receive the service(s). Why: We reviewed information from Dr. Chilton Si, your benefit plan, and any policies and guidelines needed to reach this decision. We found that receiving these services at [facility] is not medically necessary in your case: Based on [employer] Site of Care [facility] Coverage Policy, we cannot approve this request. Your records show that you have a known or suspected problem for which imaging may be  needed. The requested facility would have been approved if it were needed for one of the following reasons: -It is related to a transplant at an approved transplant site. -Your doctor needs to use contrast dye for your study, and you have a known problem with contrast dye. -Imaging done at the site prior to a planned surgery or procedure is a vital part of the service. -You need to be sedated for the imaging, and there is no freestanding site  available. -The requested site is the only one that has the size of machine you need. -Your doctor needs to observe you while pregnant. -The study will be done around the time of childbirth. -You need to have a detailed picture study (magnetic resonance imaging or MRI) in an open MRI machine that is not available at a freestanding site. -You have a chronic systemic (throughout your body) disease and this study needs to be performed at a site where your prior studies were done. -You are being treated at a Center of Excellence for the condition you have. -You need to have the study done immediately and there is no freestanding site available. We have told your doctor about this. Please talk to your doctor if you have questions. This request was reviewed by a board-certified physician: Lanetta Inch, M.D. / Title: Associate Medical Director / Specialties: Internal Medicine and Cardiovascular Disease.  If you are the requesting physician, and you would like to request a peer-to-peer conversation (available for medical necessity denials only), with an [employer] physician, your health care professional can call our Health Services Department at [phone] to discuss this decision with a physician reviewer, using case reference# [id number].  Thanks,  [employer]

## 2023-03-28 NOTE — Telephone Encounter (Signed)
Discussed with patient and will try ordering at [facility]  Replied to precert department have done so

## 2023-04-09 ENCOUNTER — Encounter (HOSPITAL_BASED_OUTPATIENT_CLINIC_OR_DEPARTMENT_OTHER): Payer: Self-pay | Admitting: Family

## 2023-04-09 ENCOUNTER — Encounter (HOSPITAL_BASED_OUTPATIENT_CLINIC_OR_DEPARTMENT_OTHER): Payer: Self-pay

## 2023-04-09 ENCOUNTER — Ambulatory Visit (HOSPITAL_BASED_OUTPATIENT_CLINIC_OR_DEPARTMENT_OTHER): Payer: Managed Care, Other (non HMO) | Admitting: Family

## 2023-04-09 VITALS — BP 102/70 | HR 74 | Ht 71.0 in | Wt 248.0 lb

## 2023-04-09 DIAGNOSIS — Z6834 Body mass index (BMI) 34.0-34.9, adult: Secondary | ICD-10-CM

## 2023-04-09 DIAGNOSIS — I25118 Atherosclerotic heart disease of native coronary artery with other forms of angina pectoris: Secondary | ICD-10-CM | POA: Diagnosis not present

## 2023-04-09 DIAGNOSIS — G90A Postural orthostatic tachycardia syndrome (POTS): Secondary | ICD-10-CM | POA: Diagnosis not present

## 2023-04-09 DIAGNOSIS — R911 Solitary pulmonary nodule: Secondary | ICD-10-CM

## 2023-04-09 DIAGNOSIS — I10 Essential (primary) hypertension: Secondary | ICD-10-CM | POA: Diagnosis not present

## 2023-04-09 DIAGNOSIS — E785 Hyperlipidemia, unspecified: Secondary | ICD-10-CM

## 2023-04-09 MED ORDER — ROSUVASTATIN CALCIUM 10 MG PO TABS
10.00 mg | ORAL_TABLET | Freq: Every day | ORAL | 3 refills | Status: AC
Start: 2023-04-09 — End: ?

## 2023-04-09 MED ORDER — AMLODIPINE BESYLATE 2.5 MG PO TABS
2.5000 mg | ORAL_TABLET | Freq: Every day | ORAL | 3 refills | Status: DC
Start: 2023-04-09 — End: 2024-04-15

## 2023-04-09 NOTE — Progress Notes (Signed)
Cardiology Office Note:  .   Date:  04/09/2023  ID:  Brittany [name], DOB 06-Jul-1986, MRN [id number] PCP: Shon Hale, MD  [facility] Providers Cardiologist:  Chilton Si, MD    History of Present Illness: .   Brittany [name] is a 37 y.o. female with a hx of ASD, hypertension, POTS, nonobstructive CAD, hyperlipidemia last seen 03/16/2022  Family history notable for hypertension in both her parents.  Father with pericarditis and PPM.  Maternal grandmother with dementia and several strokes.   ASD was found on TEE performed in 2011 due to blood clots and discoloration in the left arm.  She has been on aspirin since that time without recurrent blood clot.  Previously on Bystolic but transition to atenolol due to dysphagia. Echocardiogram July 2021 LVEF 65 to 70%, normal diastolic function.  Her POTS has been managed with atenolol.     When seen 09/12/2021 by Dr. Duke Salvia she noted hair loss, elevated blood pressure at home.  She had lost 40 pounds over the last year through diet and exercise.  Thyroid function was found to be normal.  Atenolol dose reduced due to hair loss and amlodipine 2.5 mg added for blood pressure.  Recommended for avoidance of diuretic.  Updated echocardiogram 09/27/2021 with ASD not visualized.  LVEF 60 to 65%, no RWMA, no significant valvular abnormalities.   Seen 03/16/22 and due to elevated lipid recommended for coronary calcium score to determine risk.  Amlodipine was held as BP had lowered after switching from OCP to Nexplanon.  Coronary calcium score of 4 and recommended rosuvastatin 10 mg daily.  She presents today for follow-up. Continues [place] stay [place]. Recently accomplished a 10 mile hike. She is also weight lifting 4 times per week and plans to start [employer] classes. Eating predominantly chicken or vegetarian diet. Notes occasional "hot spots" with Rosuvastatin which are overall not bothersome.  Does note some photosensitivity which she attributes to medications.  Has been waiting to take medications until after her exercise as this has improved POTS symptoms. Reports no shortness of breath nor dyspnea on exertion. Reports no chest pain, pressure, or tightness. No edema, orthopnea, PND. Reports no palpitations.    ROS: Please see the history of present illness.    All other systems reviewed and are negative.   Studies Reviewed: Marland Kitchen   EKG Interpretation Date/Time:  Monday April 09 2023 08:32:22 EDT Ventricular Rate:  74 PR Interval:  174 QRS Duration:  90 QT Interval:  394 QTC Calculation: 437 R Axis:   70  Text Interpretation: Normal sinus rhythm Normal ECG Confirmed by Gillian Shields ([id number]) on 04/09/2023 8:39:08 AM    Cardiac Studies & Procedures       ECHOCARDIOGRAM  ECHOCARDIOGRAM COMPLETE 09/27/2021  Narrative ECHOCARDIOGRAM REPORT    Patient Name:   Brittany [name] Date of Exam: 09/27/2021 Medical Rec #:  [id number]        Height:       71.0 in Accession #:    [id number]       Weight:       253.6 lb Date of Birth:  03-29-1986       BSA:          2.332 m Patient Age:    35 years         BP:           134/90 mmHg Patient Gender: F  HR:           60 bpm. Exam Location:  Outpatient  Procedure: 2D Echo, Cardiac Doppler, Color Doppler and Strain Analysis  Indications:    Q21.16 (ICD-10-CM) - Sinus venosus atrial septal defect  History:        Patient has no prior history of Echocardiogram examinations. Signs/Symptoms:Dyspnea; Risk Factors:Hypertension and Non-Smoker. Patient denies chest pain, SOB and leg edema. Patient has known small sinus venosus ASD diagnosed in [place].  Sonographer:    Carlos American RVT, RDCS (AE), RDMS Referring Phys: [phone] [facility] [place]   Sonographer Comments: Patient is morbidly obese. Image acquisition challenging due to patient body habitus. IMPRESSIONS   1. History of sinus venosus ASD. This was not visualized  on this study. The RA/RV appear normal size and the RVSP is normal. If there are clinical concerns for a sinus venosus ASD with expected PAPVR, would recommend TEE or cardiac CT for clarification. 2. Left ventricular ejection fraction, by estimation, is 60 to 65%. Left ventricular ejection fraction by 3D volume is 60 %. The left ventricle has normal function. The left ventricle has no regional wall motion abnormalities. Left ventricular diastolic parameters were normal. 3. Right ventricular systolic function is normal. The right ventricular size is normal. There is normal pulmonary artery systolic pressure. The estimated right ventricular systolic pressure is 18.8 mmHg. 4. The mitral valve is grossly normal. Trivial mitral valve regurgitation. No evidence of mitral stenosis. 5. The aortic valve is tricuspid. Aortic valve regurgitation is not visualized. No aortic stenosis is present. 6. The inferior vena cava is normal in size with greater than 50% respiratory variability, suggesting right atrial pressure of 3 mmHg.  FINDINGS Left Ventricle: Left ventricular ejection fraction, by estimation, is 60 to 65%. Left ventricular ejection fraction by 3D volume is 60 %. The left ventricle has normal function. The left ventricle has no regional wall motion abnormalities. Global longitudinal strain performed but not reported based on interpreter judgement due to suboptimal tracking. The left ventricular internal cavity size was normal in size. There is no left ventricular hypertrophy. Left ventricular diastolic parameters were normal.  Right Ventricle: The right ventricular size is normal. No increase in right ventricular wall thickness. Right ventricular systolic function is normal. There is normal pulmonary artery systolic pressure. The tricuspid regurgitant velocity is 1.99 m/s, and with an assumed right atrial pressure of 3 mmHg, the estimated right ventricular systolic pressure is 18.8 mmHg.  Left Atrium:  Left atrial size was normal in size.  Right Atrium: Right atrial size was normal in size.  Pericardium: There is no evidence of pericardial effusion.  Mitral Valve: The mitral valve is grossly normal. Trivial mitral valve regurgitation. No evidence of mitral valve stenosis.  Tricuspid Valve: The tricuspid valve is grossly normal. Tricuspid valve regurgitation is trivial. No evidence of tricuspid stenosis.  Aortic Valve: The aortic valve is tricuspid. Aortic valve regurgitation is not visualized. No aortic stenosis is present. Aortic valve mean gradient measures 4.0 mmHg. Aortic valve peak gradient measures 6.7 mmHg. Aortic valve area, by VTI measures 2.71 cm.  Pulmonic Valve: The pulmonic valve was grossly normal. Pulmonic valve regurgitation is not visualized. No evidence of pulmonic stenosis.  Aorta: The aortic root and ascending aorta are structurally normal, with no evidence of dilitation.  Venous: The inferior vena cava is normal in size with greater than 50% respiratory variability, suggesting right atrial pressure of 3 mmHg.  IAS/Shunts: The atrial septum is grossly normal.   LEFT VENTRICLE PLAX 2D  LVIDd:         5.49 cm         Diastology LVIDs:         3.29 cm         LV e' medial:    9.57 cm/s LV PW:         1.16 cm         LV E/e' medial:  7.4 LV IVS:        0.90 cm         LV e' lateral:   17.20 cm/s LVOT diam:     2.20 cm         LV E/e' lateral: 4.1 LV SV:         73 LV SV Index:   31 LVOT Area:     3.80 cm        3D Volume EF LV 3D EF:    Left ventricul LV Volumes (MOD)                            ar LV vol d, MOD    76.4 ml                    ejection A2C:                                        fraction LV vol d, MOD    91.0 ml                    by 3D A4C:                                        volume is LV vol s, MOD    32.8 ml                    60 %. A2C: LV vol s, MOD    34.2 ml A4C:                           3D Volume EF: LV SV MOD A2C:   43.6 ml        3D EF:        60 % LV SV MOD A4C:   91.0 ml       LV EDV:       120 ml LV SV MOD BP:    52.1 ml       LV ESV:       48 ml LV SV:        71 ml  RIGHT VENTRICLE RV S prime:     14.70 cm/s TAPSE (M-mode): 2.0 cm  LEFT ATRIUM             Index        RIGHT ATRIUM           Index LA diam:        3.90 cm 1.67 cm/m   RA Area:     20.70 cm LA Vol (A2C):   72.3 ml 31.00 ml/m  RA Volume:   61.90 ml  26.54 ml/m LA Vol (A4C):   59.7 ml 25.60 ml/m LA Biplane Vol: 69.1 ml 29.63 ml/m  AORTIC VALVE                    PULMONIC VALVE AV Area (Vmax):    2.74 cm     PV Vmax:       0.88 m/s AV Area (Vmean):   2.48 cm     PV Peak grad:  3.1 mmHg AV Area (VTI):     2.71 cm AV Vmax:           129.00 cm/s AV Vmean:          93.100 cm/s AV VTI:            0.268 m AV Peak Grad:      6.7 mmHg AV Mean Grad:      4.0 mmHg LVOT Vmax:         93.00 cm/s LVOT Vmean:        60.800 cm/s LVOT VTI:          0.191 m LVOT/AV VTI ratio: 0.71  AORTA Ao Root diam: 3.60 cm Ao Asc diam:  3.50 cm Ao Arch diam: 2.9 cm  MITRAL VALVE               TRICUSPID VALVE MV Area (PHT): 3.21 cm    TR Peak grad:   15.8 mmHg MV Decel Time: 236 msec    TR Vmax:        199.00 cm/s MV E velocity: 70.90 cm/s MV A velocity: 62.10 cm/s  SHUNTS MV E/A ratio:  1.14        Systemic VTI:  0.19 m Systemic Diam: 2.20 cm  Lennie Odor MD Electronically signed by Lennie Odor MD Signature Date/Time: 09/27/2021/1:00:09 PM    Final     CT SCANS  CT CARDIAC SCORING (SELF PAY ONLY) 03/24/2022  Addendum 03/24/2022 10:04 AM ADDENDUM REPORT: 03/24/2022 10:02  CLINICAL DATA:  Cardiovascular Disease Risk stratification  EXAM: Coronary Calcium Score  TECHNIQUE: A gated, non-contrast computed tomography scan of the heart was performed using 3mm slice thickness. Axial images were analyzed on a dedicated workstation. Calcium scoring of the coronary arteries was performed using the Agatston method.  FINDINGS: Coronary  arteries: Normal origins.  Coronary Calcium Score:  Left main: 0  Left anterior descending artery: 4  Left circumflex artery: 0  Right coronary artery: 0  Total: 4  Percentile: N/A  Pericardium: Normal.  Aorta: Normal caliber of ascending aorta. No aortic atherosclerosis noted.  Non-cardiac: See separate report from [facility].  IMPRESSION: Coronary calcium score of 4. This cannot be compared by percentile as patient is below the age threshold.  RECOMMENDATIONS: Coronary artery calcium (CAC) score is a strong predictor of incident coronary heart disease (CHD) and provides predictive information beyond traditional risk factors. CAC scoring is reasonable to use in the decision to withhold, postpone, or initiate statin therapy in intermediate-risk or selected borderline-risk asymptomatic adults (age 68-75 years and LDL-C >=70 to <190 mg/dL) who do not have diabetes or established atherosclerotic cardiovascular disease (ASCVD).* In intermediate-risk (10-year ASCVD risk >=7.5% to <20%) adults or selected borderline-risk (10-year ASCVD risk >=5% to <7.5%) adults in whom a CAC score is measured for the purpose of making a treatment decision the following recommendations have been made:  If CAC=0, it is reasonable to withhold statin therapy and reassess in 5 to 10 years, as long as higher risk conditions are absent (diabetes mellitus, family history of premature CHD in first degree relatives (males <55 years; females <65 years), cigarette smoking, or  LDL >=190 mg/dL).  If CAC is 1 to 99, it is reasonable to initiate statin therapy for patients >=31 years of age.  If CAC is >=100 or >=75th percentile, it is reasonable to initiate statin therapy at any age.  Cardiology referral should be considered for patients with CAC scores >=400 or >=75th percentile.  *2018 AHA/ACC/AACVPR/AAPA/ABC/ACPM/ADA/AGS/APhA/ASPC/NLA/PCNA Guideline on the Management of Blood  Cholesterol: A Report of the American College of Cardiology/American Heart Association Task Force on Clinical Practice Guidelines. J Am Coll Cardiol. 2019;73(24):3168-3209.  Jodelle Red, MD   Electronically Signed By: Jodelle Red M.D. On: 03/24/2022 10:02  Narrative EXAM: OVER-READ INTERPRETATION  PET-CT CHEST  The following report is an over-read performed by radiologist Dr. Leatha Gilding [facility] on 03/23/2022. This over-read does not include interpretation of cardiac or coronary anatomy or pathology. The cardiac CT interpretation by the cardiologist is to be attached.  COMPARISON:  None.  FINDINGS: No evidence for lymphadenopathy within the visualized mediastinum or hilar regions.  3 mm left lower lobe nodule identified on image 27/2. 3 mm right lower lobe nodule seen on 30/6/2. Lung bases otherwise unremarkable.  Visualized portions of the upper abdomen are unremarkable.  No suspicious lytic or sclerotic osseous abnormality.  IMPRESSION: 1. No acute or clinically significant extracardiac findings. 2. Tiny bilateral lower lobe pulmonary nodules measuring up to 3 mm. No follow-up needed if patient is low-risk (and has no known or suspected primary neoplasm). Non-contrast chest CT can be considered in 12 months if patient is high-risk. This recommendation follows the consensus statement: Guidelines for Management of Incidental Pulmonary Nodules Detected on CT Images: From the Fleischner Society 2017; Radiology 2017; 284:228-243.  Electronically Signed: By: Kennith Center M.D. On: 03/23/2022 08:52          Risk Assessment/Calculations:             Physical Exam:   VS:  BP 102/70   Pulse 74   Ht 5\' 11"  (1.803 m)   Wt 248 lb (112.5 kg)   BMI 34.59 kg/m    Wt Readings from Last 3 Encounters:  04/09/23 248 lb (112.5 kg)  03/16/22 243 lb 8 oz (110.5 kg)  11/15/21 252 lb 9.6 oz (114.6 kg)    GEN: Well nourished, well  developed in no acute distress NECK: No JVD; No carotid bruits CARDIAC: RRR, no murmurs, rubs, gallops RESPIRATORY:  Clear to auscultation without rales, wheezing or rhonchi  ABDOMEN: Soft, non-tender, non-distended EXTREMITIES:  No edema; No deformity   ASSESSMENT AND PLAN: .     CAD / HLD, LDL goal <70 - Stable with no anginal symptoms. No indication for ischemic evaluation.  EKG today NSR with no acute ST/T wave changes.  08/2022 LDL 72. GDMT Aspirin, Rosuvastatin.  Refill provided.  Recommend aiming for 150 minutes of moderate intensity activity per week and following a heart healthy diet.    Sinus venous atrial septal defect -she developed thrombosis in the left upper extremity 2012.  Maintained on aspirin without recurrent DVT.  TTE 09/27/2021 without evidence of ASD.  Previously diagnosed by TEE about 10 years ago in [place] with Dr. Lorina Rabon.  Further imaging deferred as she was asymptomatic.  Pulmonary  nodule - incidental finding by CT 03/2022 of tiny bilateral lower lobe pulmonary nodules up to 3mm. Repeat CT scan upcoming 04/23/23.    POTS -Triggers are dehydration and sun exposure.   She will continue to make position changes slowly, stay well-hydrated, recommend utilization of compression stockings.Continue Atenolol  PRN.    Obesity - Weight loss via diet and exercise encouraged. Discussed the impact being overweight would have on cardiovascular risk. Congratulated on regular exercise with hiking. Given information for [facility].    Hypertension - BP well controlled. Relatively hypotensive but asymptomatic with no lightheadedness, dizziness. Given her strong family history of hypertension she prefers to remain on Amlodipine 2.5mg  every [name]. Refill provided. Discussed that if she develops symptomatic hypotension could discontinue.          Dispo: follow up in one year with Dr. Duke Salvia   Signed, Alver Sorrow, NP

## 2023-04-09 NOTE — Patient Instructions (Addendum)
Medication Instructions:  Continue your current medications.   If you develop lightheadedness or dizziness associated with low blood pressure please let us know and we could stop Amlodipine.   *If you need a refill on your cardiac medications before your next appointment, please call your pharmacy*  Testing/Procedures: Your EKG today shows normal sinus rhythm which is a great result!   Follow-Up: At [facility], you and your health needs are our priority.  As part of our continuing mission to provide you with exceptional heart care, we have created designated Provider Care Teams.  These Care Teams include your primary Cardiologist (physician) and Advanced Practice Providers (APPs -  Physician Assistants and Nurse Practitioners) who all work together to provide you with the care you need, when you need it.  We recommend signing up for the patient portal called "MyChart".  Sign up information is provided on this After Visit Summary.  MyChart is used to connect with patients for Virtual Visits (Telemedicine).  Patients are able to view lab/test results, encounter notes, upcoming appointments, etc.  Non-urgent messages can be sent to your provider as well.   To learn more about what you can do with MyChart, go to [email or web address].    Your next appointment:   1 year(s)  Provider:   Chilton Si, MD or Gillian Shields, NP    Other Instructions  [facility] [address] [place], [place] [address] Call: [phone]  Heart Healthy Diet Recommendations: A low-salt diet is recommended. Meats should be grilled, baked, or boiled. Avoid fried foods. Focus on lean protein sources like fish or chicken with vegetables and fruits. The American Heart Association is a Chief Technology Officer!  American Heart Association Diet and Lifeystyle Recommendations   Exercise recommendations: The American Heart Association recommends 150 minutes of moderate intensity exercise weekly. Try 30  minutes of moderate intensity exercise 4-5 times per week. This could include walking, jogging, or swimming.

## 2023-04-19 ENCOUNTER — Telehealth: Payer: Self-pay | Admitting: Cardiovascular Disease

## 2023-04-19 DIAGNOSIS — R911 Solitary pulmonary nodule: Secondary | ICD-10-CM

## 2023-04-19 NOTE — Telephone Encounter (Signed)
Returned call to [place] with [facility] with information from precert team.     "Hello,   Pt's [name] was denied 6/24. Appt for 6/26 was cancelled because of denial. Not sure why pt was put back on schedule."

## 2023-04-19 NOTE — Telephone Encounter (Signed)
[facility] is calling concerning upcoming test patient has scheduled. They state that this appears to have been denied with insurance and would like to know how to proceed with scheduling. Should they run it a different way or cancel the appt. Please advise.

## 2023-04-19 NOTE — Telephone Encounter (Signed)
Precert please advise

## 2023-04-19 NOTE — Telephone Encounter (Signed)
Left message to call back  

## 2023-04-19 NOTE — Telephone Encounter (Signed)
Patient is following up. She states her insurance continuously denies her CT scan. She is not sure how to proceed with testing. Patient would like to know if she should go through her insurance to have them refer her at this stage. Please advise.

## 2023-04-20 NOTE — Telephone Encounter (Signed)
Spoke with Kendal Hymen at [employer] regarding PA, date submitted 6/18 Was told by PA department 6/24 that the PA for CT itself approved just not at the [facility] location  Sent message to PA department to resubmit PA with [facility] location   [place] with patient and the letter she received was denial for [facility] site  If unable to get PA for [facility] will reach out to the [place] on [employer] location

## 2023-04-23 ENCOUNTER — Other Ambulatory Visit: Payer: Managed Care, Other (non HMO)

## 2023-04-23 NOTE — Addendum Note (Signed)
Addended by: Regis Bill B on: 04/23/2023 05:05 PM   Modules accepted: Orders

## 2023-04-23 NOTE — Telephone Encounter (Signed)
Heard back from [place] team and will require appeal Called [facility] and scheduled CT of chest w/o contrast for Monday 7/29 arrive at 3:45 for 4:00 pm  Advised patient, verbalized understanding  Faxed order and sent to PA department

## 2023-04-26 ENCOUNTER — Telehealth: Payer: Self-pay | Admitting: Cardiovascular Disease

## 2023-04-26 NOTE — Telephone Encounter (Signed)
The referral has incorrect location on it, and insurance is not covering it. The location needs to be update. Patient is schd for sugery 7/29. Please advise   NPI: [id number]

## 2023-04-26 NOTE — Telephone Encounter (Signed)
Hey, I see that you have been working on this, not sure what to with this?

## 2023-04-27 NOTE — Telephone Encounter (Signed)
Patient is calling requesting call back to discuss update on this auth.

## 2023-05-01 NOTE — Telephone Encounter (Signed)
Follow up message sent to precert department

## 2023-05-07 NOTE — Telephone Encounter (Signed)
Good afternoon, I was able to send an appeal and get pt's auth approved with [facility]. I've updated notes and referral.   Above message received via secure chat form Anisah Abdul-Razzaaq  Will call patient and [facility] tomorrow to try to get rescheduled

## 2023-05-08 ENCOUNTER — Encounter (HOSPITAL_BASED_OUTPATIENT_CLINIC_OR_DEPARTMENT_OTHER): Payer: Self-pay

## 2023-05-08 NOTE — Telephone Encounter (Signed)
Patient scheduled 8/13 arrive at 4 for 4:15 pm at the [facility] on [facility] location  Mychart message sent to patient and she has read

## 2023-05-08 NOTE — Telephone Encounter (Signed)
Prior auth obtained  Patient scheduled 8/13 arrive at 4 for 4:15 pm at the [facility] location  Mychart message sent to patient and she has read

## 2023-05-08 NOTE — Telephone Encounter (Signed)
As requested

## 2023-05-25 IMAGING — CT CT CARDIAC CORONARY ARTERY CALCIUM SCORE
3 series · 14 of 20 positions shown, 16 images · non-contrast
Comparison: None.
COMPARISON: None.

Addendum:
EXAM:
OVER-READ INTERPRETATION  PET-CT CHEST

The following report is an over-read performed by radiologist Dr.
does not include interpretation of cardiac or coronary anatomy or
pathology. The cardiac CT interpretation by the cardiologist is to
be attached.
TECHNIQUE: A gated, non-contrast computed tomography scan of the heart was
performed using 3mm slice thickness. Axial images were analyzed on a
dedicated workstation. Calcium scoring of the coronary arteries was
performed using the Agatston method.

[Series 2: ax lung · axial · 0.95mm/px · z∈[+1438,+1528]mm · 5 of 69 slices shown]
[im 12/69  lung]
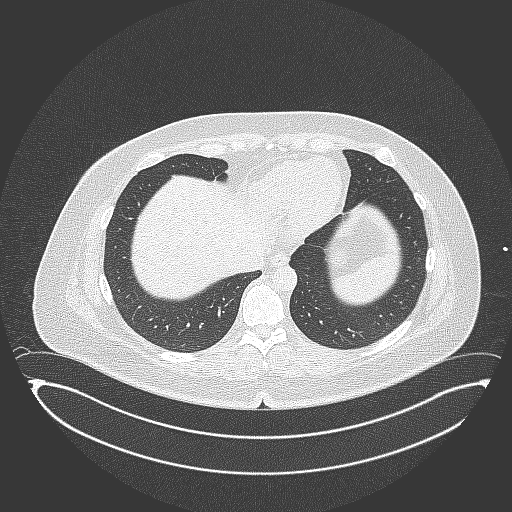
[im 23/69  lung]
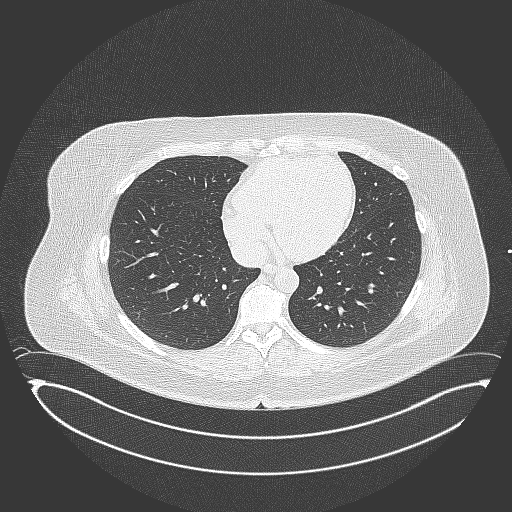
[im 35/69  lung]
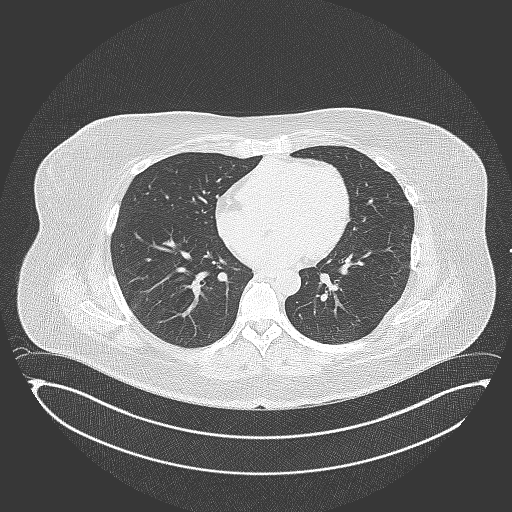
[im 46/69  lung]
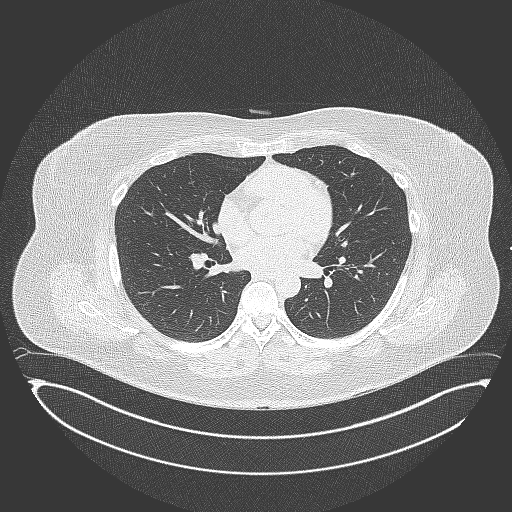
[im 57/69  lung]
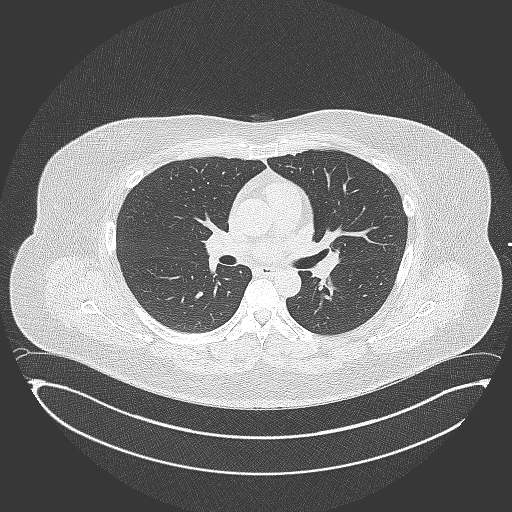

[Series 3: cascseq 3.0 sa36 70% (id) · axial · 0.39mm/px · z∈[+1450,+1516]mm · 3 of 46 slices shown]
[im 12/46  vessel]
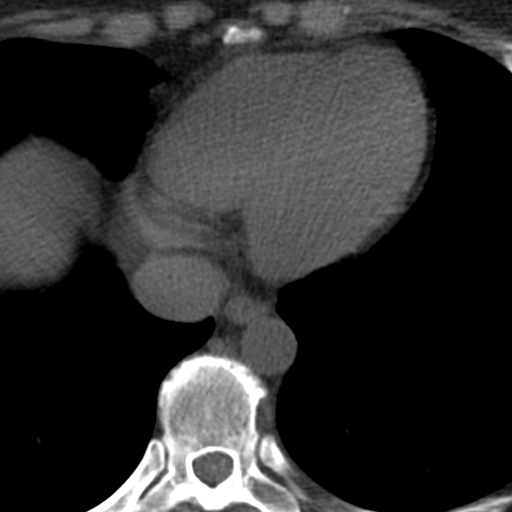
[im 23/46  vessel]
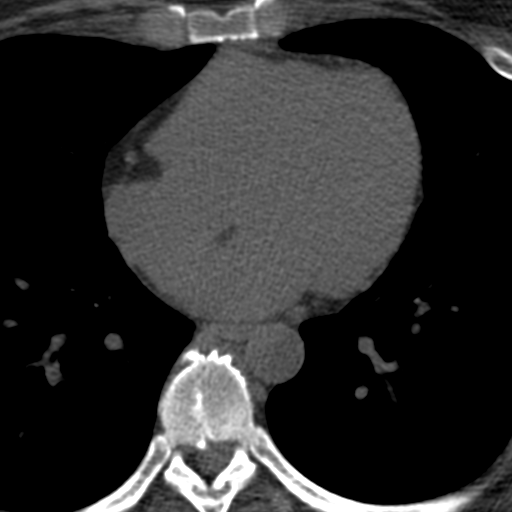
[im 34/46  vessel]
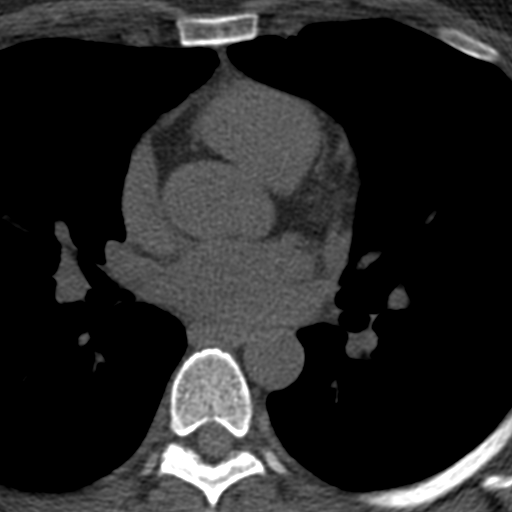

[Series 4: ax st · axial · 0.74mm/px · z∈[+1434,+1532]mm · 6 of 69 slices shown, 8 images]
[im 10/69  vessel]
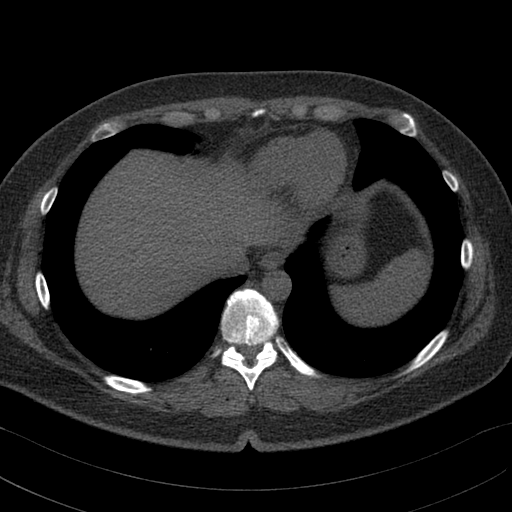
[im 10/69  lung]
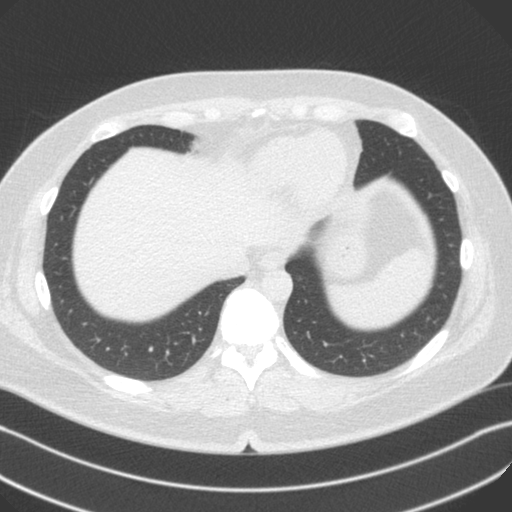
[im 20/69  vessel]
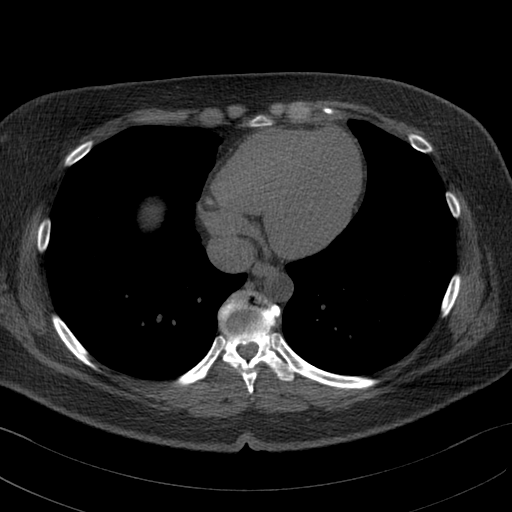
[im 30/69  vessel]
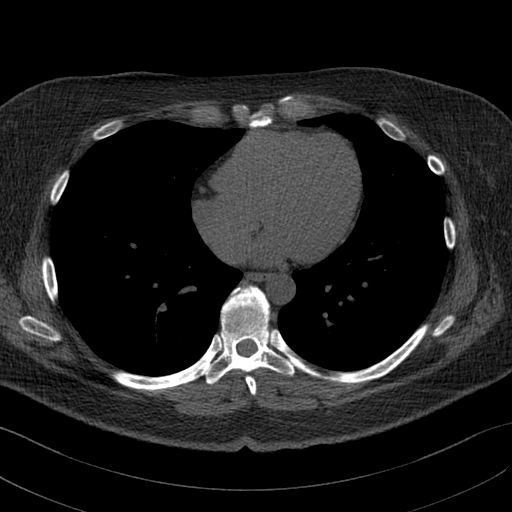
[im 39/69  vessel]
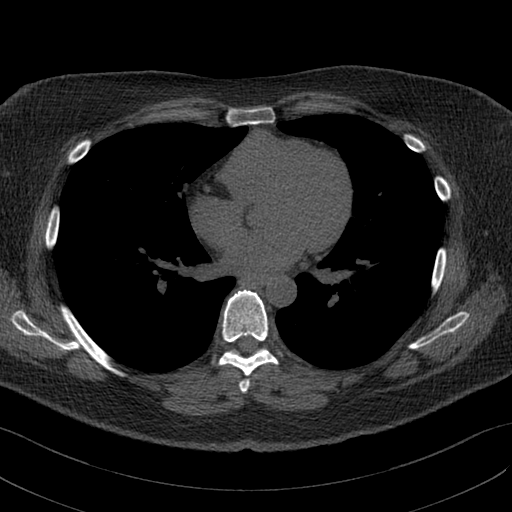
[im 49/69  vessel]
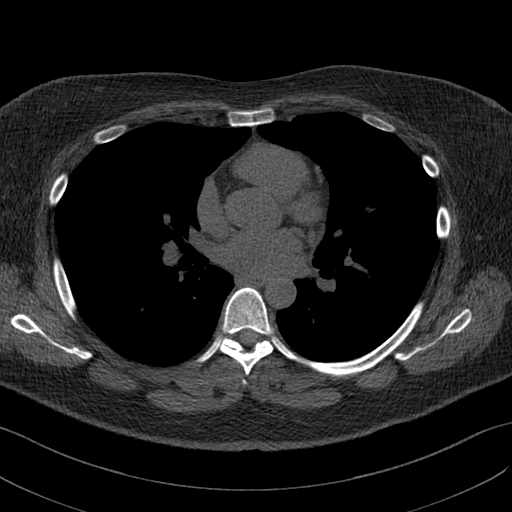
[im 49/69  lung]
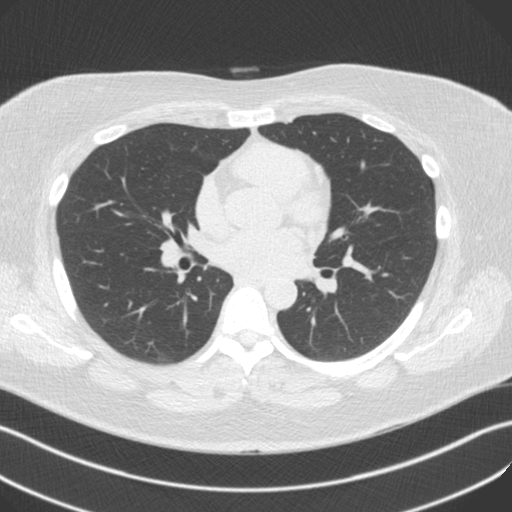
[im 59/69  vessel]
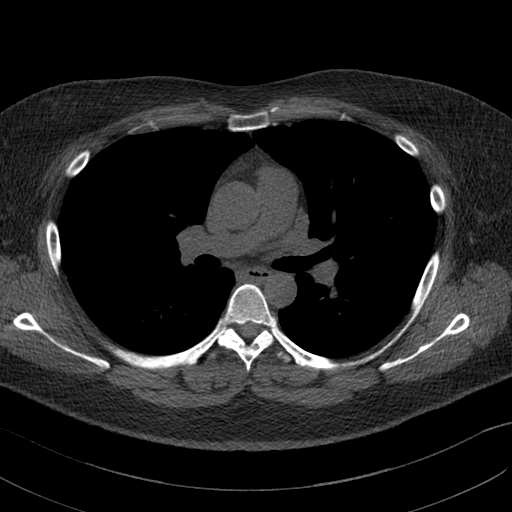

[14 of 20 positions shown; findings below may reference images not displayed]

FINDINGS: No evidence for lymphadenopathy within the visualized mediastinum or
hilar regions.

3 mm left lower lobe nodule identified on image [DATE]. 3 mm right
lower lobe nodule seen on 29/03/03. Lung bases otherwise unremarkable.

Visualized portions of the upper abdomen are unremarkable.

No suspicious lytic or sclerotic osseous abnormality.
IMPRESSION: 1. No acute or clinically significant extracardiac findings.
2. Tiny bilateral lower lobe pulmonary nodules measuring up to 3 mm.
No follow-up needed if patient is low-risk (and has no known or
suspected primary neoplasm). Non-contrast chest CT can be considered
in 12 months if patient is high-risk. This recommendation follows
the consensus statement: Guidelines for Management of Incidental
Pulmonary Nodules Detected on CT Images: From the [REDACTED]AL DATA:  Cardiovascular Disease Risk stratification

EXAM:
Coronary Calcium Score
FINDINGS: Coronary arteries: Normal origins.

Coronary Calcium Score:

Left main: 0

Left anterior descending artery: 4

Left circumflex artery: 0

Right coronary artery: 0

Total: 4

Percentile: N/A

Pericardium: Normal.

Aorta: Normal caliber of ascending aorta. No aortic atherosclerosis
noted.

Non-cardiac: See separate report from [REDACTED].
IMPRESSION: Coronary calcium score of 4. This cannot be compared by percentile
as patient is below the age threshold.



If CAC=0, it is reasonable to withhold statin therapy and reassess
in 5 to 10 years, as long as higher risk conditions are absent
(diabetes mellitus, family history of premature CHD in first degree
relatives (males <55 years; females <65 years), cigarette smoking,
or LDL >=190 mg/dL).

If CAC is 1 to 99, it is reasonable to initiate statin therapy for
patients >=55 years of age.

If CAC is >=100 or >=75th percentile, it is reasonable to initiate
statin therapy at any age.

Cardiology referral should be considered for patients with CAC
scores >=400 or >=75th percentile.

*4761 AHA/ACC/AACVPR/AAPA/ABC/ALISH/FLEUR/BENGE/Ronlor/CLERGER FEDELINE/SARUBA/JASMINE
Guideline on the Management of Blood Cholesterol: A Report of the
American College of Cardiology/American Heart Association Task Force
on Clinical Practice Guidelines. J Am Coll Cardiol.
1884;73(24):3207-3325.

*** End of Addendum ***
EXAM:
OVER-READ INTERPRETATION  PET-CT CHEST

The following report is an over-read performed by radiologist Dr.
does not include interpretation of cardiac or coronary anatomy or
pathology. The cardiac CT interpretation by the cardiologist is to
be attached.
FINDINGS: No evidence for lymphadenopathy within the visualized mediastinum or
hilar regions.

3 mm left lower lobe nodule identified on image [DATE]. 3 mm right
lower lobe nodule seen on 29/03/03. Lung bases otherwise unremarkable.

Visualized portions of the upper abdomen are unremarkable.

No suspicious lytic or sclerotic osseous abnormality.
IMPRESSION: 1. No acute or clinically significant extracardiac findings.
2. Tiny bilateral lower lobe pulmonary nodules measuring up to 3 mm.
No follow-up needed if patient is low-risk (and has no known or
suspected primary neoplasm). Non-contrast chest CT can be considered
in 12 months if patient is high-risk. This recommendation follows
the consensus statement: Guidelines for Management of Incidental
Pulmonary Nodules Detected on CT Images: From the [HOSPITAL]

## 2024-03-17 ENCOUNTER — Other Ambulatory Visit (HOSPITAL_BASED_OUTPATIENT_CLINIC_OR_DEPARTMENT_OTHER): Payer: Self-pay | Admitting: Family

## 2024-03-17 DIAGNOSIS — E785 Hyperlipidemia, unspecified: Secondary | ICD-10-CM

## 2024-03-17 DIAGNOSIS — I25118 Atherosclerotic heart disease of native coronary artery with other forms of angina pectoris: Secondary | ICD-10-CM

## 2024-03-18 ENCOUNTER — Encounter (HOSPITAL_BASED_OUTPATIENT_CLINIC_OR_DEPARTMENT_OTHER): Payer: Self-pay

## 2024-03-18 DIAGNOSIS — E785 Hyperlipidemia, unspecified: Secondary | ICD-10-CM

## 2024-03-18 DIAGNOSIS — I25118 Atherosclerotic heart disease of native coronary artery with other forms of angina pectoris: Secondary | ICD-10-CM

## 2024-03-19 MED ORDER — ROSUVASTATIN CALCIUM 10 MG PO TABS: 10.0000 mg | ORAL_TABLET | Freq: Every day | ORAL | 1 refills | Status: DC

## 2024-04-14 ENCOUNTER — Other Ambulatory Visit (HOSPITAL_BASED_OUTPATIENT_CLINIC_OR_DEPARTMENT_OTHER): Payer: Self-pay | Admitting: Family

## 2024-04-14 DIAGNOSIS — I10 Essential (primary) hypertension: Secondary | ICD-10-CM

## 2024-06-16 NOTE — Progress Notes (Unsigned)
 Cardiology Office Note   Date:  06/17/2024  ID:  [name], DOB 04-Jun-1986, MRN [id number] PCP: Chrystal Lamarr RAMAN, MD  [facility] Providers Cardiologist:  Annabella Scarce, MD Cardiology APP:  Vannie Reche RAMAN, NP     History of Present Illness Brittany [name] is a 38 y.o. female  with a hx of ASD, hypertension, POTS, CAD.   Family history notable for hypertension in both her parents.  Father with pericarditis and PPM.  Maternal grandmother with dementia and several strokes.   ASD was found on TEE performed in 2011 due to blood clots and discoloration in the left arm.  She has been on aspirin since that time without recurrent blood clot.  Previously on Bystolic but transition to atenolol  due to dysphagia. Echocardiogram July 2021 LVEF 65 to 70%, normal diastolic function.  Her POTS has been managed with atenolol .     When seen 09/12/2021 by Dr. Scarce she noted hair loss, elevated blood pressure at home.  She had lost 40 pounds over the last year through diet and exercise.  Thyroid function was found to be normal.  Atenolol  dose reduced due to hair loss and amlodipine  2.5 mg added for blood pressure.  Recommended for avoidance of diuretic.  Updated echocardiogram 09/27/2021 with ASD not visualized.  LVEF 60 to 65%, no RWMA, no significant valvular abnormalities.   Seen 03/16/22 and due to elevated lipid recommended for coronary calcium  score to determine risk.  Amlodipine  was held as BP had lowered after switching from OCP to Nexplanon.  Coronary calcium  score of 4 and recommended rosuvastatin  10 mg daily.  At visit 04/09/23 she was hiking for exercise and weight lifting. She was without angina. Her POTS was well managed.    She presents today for follow-up. She continues to stay active, running 10 miles weekly, hiking and weight lifting. Eats 1700 calories daily, mostly poultry, fish, fruits and vegetables. She notes a 20lb weight gain over the last year. She endorses  work stress and changes with staffing. She is no longer taking atenolol , which caused migraines post-exercise. She takes amlodipine  after exercise. During cardio, she does a run/walk combination due to HR variability - up to 190s when running. She notes occasional, fleeting palpitations mostly when going to sleep that occur on average once monthly.   She is compliant with and tolerating her medications. Home BPs 125-130s/80s-90s.   She denies chest pain, pressure, heaviness, shortness of breath, lightheadedness/dizziness. Does report weight gain.   She discussed GLP-1 medications with PCP given weight gain despite optimizing lifestyle modifications. She notes baseline slowed GI motility.   She plans to have her Nexplanon removed due to month-long heavy menses and a transvaginal ultrasound. No personal of family history of PCOS or endometriosis.   ROS: Please see the history of present illness.    All other systems reviewed and are negative.   Studies Reviewed EKG Interpretation Date/Time:  Tuesday June 17 2024 08:04:14 EDT Ventricular Rate:  110 PR Interval:  152 QRS Duration:  86 QT Interval:  334 QTC Calculation: 452 R Axis:   38  Text Interpretation: Sinus tachycardia Confirmed by Vannie Reche ([id number]) on 06/17/2024 8:08:27 AM    Cardiac Studies & Procedures   ______________________________________________________________________________________________     ECHOCARDIOGRAM  ECHOCARDIOGRAM COMPLETE 09/27/2021  Narrative ECHOCARDIOGRAM REPORT    Patient Name:   [name] Date of Exam: 09/27/2021 Medical Rec #:  [id number]        Height:       71.0 in  Accession #:    [id number]       Weight:       253.6 lb Date of Birth:  1985-11-30       BSA:          2.332 m Patient Age:    35 years         BP:           134/90 mmHg Patient Gender: F                HR:           60 bpm. 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 Signs/Symptoms:Dyspnea; Risk Factors:Hypertension and Non-Smoker. Patient denies chest pain, SOB and leg edema. Patient has known small sinus venosus ASD diagnosed in [place] .  Sonographer:    Annabella Cater RVT, RDCS (AE), RDMS Referring Phys: [phone] [facility]   Sonographer Comments: Patient is morbidly obese. Image acquisition challenging due to patient body habitus. IMPRESSIONS   1. History of sinus venosus ASD. This was not visualized on this study. The RA/RV appear normal size and the RVSP is normal. If there are clinical concerns for a sinus venosus ASD with expected PAPVR, would recommend TEE or cardiac CT for clarification. 2. Left ventricular ejection fraction, by estimation, is 60 to 65%. Left ventricular ejection fraction by 3D volume is 60 %. The left ventricle has normal function. The left ventricle has no regional wall motion abnormalities. Left ventricular diastolic parameters were normal. 3. Right ventricular systolic function is normal. The right ventricular size is normal. There is normal pulmonary artery systolic pressure. The estimated right ventricular systolic pressure is 18.8 mmHg. 4. The mitral valve is grossly normal. Trivial mitral valve regurgitation. No evidence of mitral stenosis. 5. The aortic valve is tricuspid. Aortic valve regurgitation is not visualized. No aortic stenosis is present. 6. The inferior vena cava is normal in size with greater than 50% respiratory variability, suggesting right atrial pressure of 3 mmHg.  FINDINGS Left Ventricle: Left ventricular ejection fraction, by estimation, is 60 to 65%. Left ventricular ejection fraction by 3D volume is 60 %. The left ventricle has normal function. The left ventricle has no regional wall motion abnormalities. Global longitudinal  strain performed but not reported based on interpreter judgement due to suboptimal tracking. The left ventricular internal cavity size was normal in size. There is no left ventricular hypertrophy. Left ventricular diastolic parameters were normal.  Right Ventricle: The right ventricular size is normal. No increase in right ventricular wall thickness. Right ventricular systolic function is normal. There is normal pulmonary artery systolic pressure. The tricuspid regurgitant velocity is 1.99 m/s, and with an assumed right atrial pressure of 3 mmHg, the estimated right ventricular systolic pressure is 18.8 mmHg.  Left Atrium: Left atrial size was normal in size.  Right Atrium: Right atrial size was normal in size.  Pericardium: There is no evidence of pericardial effusion.  Mitral Valve: The mitral valve is grossly normal. Trivial mitral valve regurgitation. No evidence of mitral valve stenosis.  Tricuspid Valve: The tricuspid valve is grossly normal. Tricuspid valve regurgitation is trivial. No evidence of tricuspid stenosis.  Aortic Valve: The aortic valve is tricuspid. Aortic valve regurgitation is not visualized. No aortic stenosis is present. Aortic valve mean gradient measures  4.0 mmHg. Aortic valve peak gradient measures 6.7 mmHg. Aortic valve area, by VTI measures 2.71 cm.  Pulmonic Valve: The pulmonic valve was grossly normal. Pulmonic valve regurgitation is not visualized. No evidence of pulmonic stenosis.  Aorta: The aortic root and ascending aorta are structurally normal, with no evidence of dilitation.  Venous: The inferior vena cava is normal in size with greater than 50% respiratory variability, suggesting right atrial pressure of 3 mmHg.  IAS/Shunts: The atrial septum is grossly normal.   LEFT VENTRICLE PLAX 2D LVIDd:         5.49 cm         Diastology LVIDs:         3.29 cm         LV e' medial:    9.57 cm/s LV PW:         1.16 cm         LV E/e' medial:  7.4 LV IVS:         0.90 cm         LV e' lateral:   17.20 cm/s LVOT diam:     2.20 cm         LV E/e' lateral: 4.1 LV SV:         73 LV SV Index:   31 LVOT Area:     3.80 cm        3D Volume EF LV 3D EF:    Left ventricul LV Volumes (MOD)                            ar LV vol d, MOD    76.4 ml                    ejection A2C:                                        fraction LV vol d, MOD    91.0 ml                    by 3D A4C:                                        volume is LV vol s, MOD    32.8 ml                    60 %. A2C: LV vol s, MOD    34.2 ml A4C:                           3D Volume EF: LV SV MOD A2C:   43.6 ml       3D EF:        60 % LV SV MOD A4C:   91.0 ml       LV EDV:       120 ml LV SV MOD BP:    52.1 ml       LV ESV:       48 ml LV SV:        71 ml  RIGHT VENTRICLE RV S prime:     14.70 cm/s TAPSE (M-mode): 2.0 cm  LEFT ATRIUM  Index        RIGHT ATRIUM           Index LA diam:        3.90 cm 1.67 cm/m   RA Area:     20.70 cm LA Vol (A2C):   72.3 ml 31.00 ml/m  RA Volume:   61.90 ml  26.54 ml/m LA Vol (A4C):   59.7 ml 25.60 ml/m LA Biplane Vol: 69.1 ml 29.63 ml/m AORTIC VALVE                    PULMONIC VALVE AV Area (Vmax):    2.74 cm     PV Vmax:       0.88 m/s AV Area (Vmean):   2.48 cm     PV Peak grad:  3.1 mmHg AV Area (VTI):     2.71 cm AV Vmax:           129.00 cm/s AV Vmean:          93.100 cm/s AV VTI:            0.268 m AV Peak Grad:      6.7 mmHg AV Mean Grad:      4.0 mmHg LVOT Vmax:         93.00 cm/s LVOT Vmean:        60.800 cm/s LVOT VTI:          0.191 m LVOT/AV VTI ratio: 0.71  AORTA Ao Root diam: 3.60 cm Ao Asc diam:  3.50 cm Ao Arch diam: 2.9 cm  MITRAL VALVE               TRICUSPID VALVE MV Area (PHT): 3.21 cm    TR Peak grad:   15.8 mmHg MV Decel Time: 236 msec    TR Vmax:        199.00 cm/s MV E velocity: 70.90 cm/s MV A velocity: 62.10 cm/s  SHUNTS MV E/A ratio:  1.14        Systemic VTI:  0.19 m Systemic Diam:  2.20 cm  Darryle Decent MD Electronically signed by Darryle Decent MD Signature Date/Time: 09/27/2021/1:00:09 PM    Final      CT SCANS  CT CARDIAC SCORING (SELF PAY ONLY) 03/23/2022  Addendum 03/24/2022 10:04 AM ADDENDUM REPORT: 03/24/2022 10:02  CLINICAL DATA:  Cardiovascular Disease Risk stratification  EXAM: Coronary Calcium  Score  TECHNIQUE: A gated, non-contrast computed tomography scan of the heart was performed using 3mm slice thickness. Axial images were analyzed on a dedicated workstation. Calcium  scoring of the coronary arteries was performed using the Agatston method.  FINDINGS: Coronary arteries: Normal origins.  Coronary Calcium  Score:  Left main: 0  Left anterior descending artery: 4  Left circumflex artery: 0  Right coronary artery: 0  Total: 4  Percentile: N/A  Pericardium: Normal.  Aorta: Normal caliber of ascending aorta. No aortic atherosclerosis noted.  Non-cardiac: See separate report from [facility].  IMPRESSION: Coronary calcium  score of 4. This cannot be compared by percentile as patient is below the age threshold.  RECOMMENDATIONS: Coronary artery calcium  (CAC) score is a strong predictor of incident coronary heart disease (CHD) and provides predictive information beyond traditional risk factors. CAC scoring is reasonable to use in the decision to withhold, postpone, or initiate statin therapy in intermediate-risk or selected borderline-risk asymptomatic adults (age 107-75 years and LDL-C >=70 to <190 mg/dL) who do not have diabetes or established atherosclerotic cardiovascular disease (ASCVD).* In intermediate-risk (10-year ASCVD risk >=  7.5% to <20%) adults or selected borderline-risk (10-year ASCVD risk >=5% to <7.5%) adults in whom a CAC score is measured for the purpose of making a treatment decision the following recommendations have been made:  If CAC=0, it is reasonable to withhold statin therapy and  reassess in 5 to 10 years, as long as higher risk conditions are absent (diabetes mellitus, family history of premature CHD in first degree relatives (males <55 years; females <65 years), cigarette smoking, or LDL >=190 mg/dL).  If CAC is 1 to 99, it is reasonable to initiate statin therapy for patients >=64 years of age.  If CAC is >=100 or >=75th percentile, it is reasonable to initiate statin therapy at any age.  Cardiology referral should be considered for patients with CAC scores >=400 or >=75th percentile.  *2018 AHA/ACC/AACVPR/AAPA/ABC/ACPM/ADA/AGS/APhA/ASPC/NLA/PCNA Guideline on the Management of Blood Cholesterol: A Report of the American College of Cardiology/American Heart Association Task Force on Clinical Practice Guidelines. J Am Coll Cardiol. 2019;73(24):3168-3209.  Shelda Bruckner, MD   Electronically Signed By: Shelda Bruckner M.D. On: 03/24/2022 10:02  Narrative EXAM: OVER-READ INTERPRETATION  PET-CT CHEST  The following report is an over-read performed by radiologist Dr. Camellia Lang [facility] on 03/23/2022. This over-read does not include interpretation of cardiac or coronary anatomy or pathology. The cardiac CT interpretation by the cardiologist is to be attached.  COMPARISON:  None.  FINDINGS: No evidence for lymphadenopathy within the visualized mediastinum or hilar regions.  3 mm left lower lobe nodule identified on image 27/2. 3 mm right lower lobe nodule seen on 30/6/2. Lung bases otherwise unremarkable.  Visualized portions of the upper abdomen are unremarkable.  No suspicious lytic or sclerotic osseous abnormality.  IMPRESSION: 1. No acute or clinically significant extracardiac findings. 2. Tiny bilateral lower lobe pulmonary nodules measuring up to 3 mm. No follow-up needed if patient is low-risk (and has no known or suspected primary neoplasm). Non-contrast chest CT can be considered in 12 months if  patient is high-risk. This recommendation follows the consensus statement: Guidelines for Management of Incidental Pulmonary Nodules Detected on CT Images: From the Fleischner Society 2017; Radiology 2017; 284:228-243.  Electronically Signed: By: Camellia Candle M.D. On: 03/23/2022 08:52     ______________________________________________________________________________________________      Risk Assessment/Calculations           Physical Exam VS:  BP 120/84 (BP Location: Left Arm, Patient Position: Sitting, Cuff Size: Normal)   Pulse (!) 104   Resp 16   Ht 5' 11 (1.803 m)   Wt 259 lb (117.5 kg)   SpO2 98%   BMI 36.12 kg/m        Wt Readings from Last 3 Encounters:  06/17/24 259 lb (117.5 kg)  04/09/23 248 lb (112.5 kg)  03/16/22 243 lb 8 oz (110.5 kg)    GEN: Well nourished, well developed in no acute distress NECK: No JVD; No carotid bruits CARDIAC: RRR, no murmurs, rubs, gallops RESPIRATORY:  Clear to auscultation without rales, wheezing or rhonchi  ABDOMEN: Soft, non-tender, non-distended EXTREMITIES:  No edema; No deformity   ASSESSMENT AND PLAN  CAD / HLD, LDL goal <70 - CAC score of 4 in LAD (2023); Labs 06/10/24 reviewed: TC 140, LDL 82, HDL 41, TGs 91; prior LDL 69 >> 82; CAD stable with no anginal symptoms. No indication for ischemia evaluation. EKG today ST at 104, no ST/T wave changes.  Discussed LDL goals for primary prevention and options for management: increase rosuvastatin  to 20mg  daily OR  repeat fasting labs in 6 months after diet and lifestyle modifications Referral to [facility] Nutritionist placed per request Repeat lipid panel due ~ March 2026. If LDL not at goal at that time plan to increase Rosuvastatin  dose.  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  Maintained on aspirin without recurrent DVT.  TTE 09/27/2021 without evidence of ASD.   Previously diagnosed by TEE about 10 years ago in [place]  with Dr. Zenovia.  Further imaging deferred as she was asymptomatic. Continue aspirin 81mg  daily CBC 06/2024: Hgb 14.0, Hct 41.4, Plt 221   Pulmonary  nodule - Incidental finding by CT 03/2022 of tiny bilateral lower lobe pulmonary nodules up to 3mm. Repeat CT scan 05/16/2023 through [facility] with similar findings. Repeat scan for surveillance optional if high risk. She is a non-smoker. As not high risk, no repeat CT scan recommended.   POTS -Triggers are dehydration and sun exposure.   She will continue to make position changes slowly, stay well-hydrated, recommend utilization of compression stockings, don UV protective clothing when hiking. Atenolol  PRN prescribed with rare use, difficulty tolerating with headache.  Continue to monitor palpitations and consider alternative beta blocker as needed   Obesity - Weight loss via diet and exercise encouraged. Discussed the impact being overweight would have on cardiovascular risk. Already maintains 1700 calorie/day diet and regular, routine exercise with cardio and weight training. Labs 06/2024: LDL 82, TGs 91, fasting glucose 89. Discussed GLP1 medication, mechanism of action, side effects and referral to nutritionist to further optimize diet Referral to nutritionist placed Consider Zepbound or Wegovy, pending weight loss goals   Hypertension - Controlled by in office and home BP readings of 125-130s/80s-90s Continue amlodipine  2.5mg  daily as prescribed.       Dispo: 1 year  Signed, Reche GORMAN Finder, NP

## 2024-06-17 ENCOUNTER — Ambulatory Visit (HOSPITAL_BASED_OUTPATIENT_CLINIC_OR_DEPARTMENT_OTHER): Admitting: Family

## 2024-06-17 ENCOUNTER — Encounter (HOSPITAL_BASED_OUTPATIENT_CLINIC_OR_DEPARTMENT_OTHER): Payer: Self-pay | Admitting: Family

## 2024-06-17 VITALS — BP 120/84 | HR 104 | Resp 16 | Ht 71.0 in | Wt 259.0 lb

## 2024-06-17 DIAGNOSIS — Z6834 Body mass index (BMI) 34.0-34.9, adult: Secondary | ICD-10-CM | POA: Diagnosis not present

## 2024-06-17 DIAGNOSIS — E785 Hyperlipidemia, unspecified: Secondary | ICD-10-CM

## 2024-06-17 DIAGNOSIS — I25118 Atherosclerotic heart disease of native coronary artery with other forms of angina pectoris: Secondary | ICD-10-CM

## 2024-06-17 DIAGNOSIS — G90A Postural orthostatic tachycardia syndrome (POTS): Secondary | ICD-10-CM

## 2024-06-17 DIAGNOSIS — I10 Essential (primary) hypertension: Secondary | ICD-10-CM | POA: Diagnosis not present

## 2024-06-17 NOTE — Patient Instructions (Signed)
 Medication Instructions:   Your physician recommends that you continue on your current medications as directed. Please refer to the Current Medication list given to you today.   *If you need a refill on your cardiac medications before your next appointment, please call your pharmacy*   You have been referred to Amb Referral to Nutrition and Diabetic Education --THE REFERRAL WAS PLACED AND THEIR OFFICE WILL BE THE ONE'S TO REACH OUT TO YOU TO ARRANGE THIS APPOINTMENT   Lab Work:  IN 6 MONTHS (AROUND MARCH 2026) HERE ON THE 3RD FLOOR AT [address] AT PRIMARY CARE--LIPIDS--PLEASE COME FASTING TO THIS LAB APPOINTMENT  If you have labs (blood work) drawn today and your tests are completely normal, you will receive your results only by: MyChart Message (if you have MyChart) OR A paper copy in the mail If you have any lab test that is abnormal or we need to change your treatment, we will call you to review the results.    Follow-Up: At [facility], you and your health needs are our priority.  As part of our continuing mission to provide you with exceptional heart care, our providers are all part of one team.  This team includes your primary Cardiologist (physician) and Advanced Practice Providers or APPs (Physician Assistants and Nurse Practitioners) who all work together to provide you with the care you need, when you need it.  Your next appointment:   1 year(s)  Provider:   Reche Finder, NP

## 2024-07-14 ENCOUNTER — Other Ambulatory Visit (HOSPITAL_BASED_OUTPATIENT_CLINIC_OR_DEPARTMENT_OTHER): Payer: Self-pay | Admitting: Family

## 2024-07-14 DIAGNOSIS — I10 Essential (primary) hypertension: Secondary | ICD-10-CM

## 2024-08-13 ENCOUNTER — Ambulatory Visit: Admitting: Skilled Nursing Facility1

## 2024-09-12 ENCOUNTER — Other Ambulatory Visit (HOSPITAL_BASED_OUTPATIENT_CLINIC_OR_DEPARTMENT_OTHER): Payer: Self-pay | Admitting: Cardiovascular Disease

## 2024-09-12 DIAGNOSIS — E785 Hyperlipidemia, unspecified: Secondary | ICD-10-CM

## 2024-09-12 DIAGNOSIS — I25118 Atherosclerotic heart disease of native coronary artery with other forms of angina pectoris: Secondary | ICD-10-CM

## 2024-11-11 ENCOUNTER — Encounter: Admitting: Skilled Nursing Facility1
